# Patient Record
Sex: Male | Born: 1965 | Race: White | Hispanic: No | Marital: Married | Smoking: Former smoker
Health system: Southern US, Community
[De-identification: ages and names within clinical notes are randomized; demographics above are authoritative.]

## PROBLEM LIST (undated history)

## (undated) DIAGNOSIS — F329 Major depressive disorder, single episode, unspecified: Secondary | ICD-10-CM

## (undated) DIAGNOSIS — M1990 Unspecified osteoarthritis, unspecified site: Secondary | ICD-10-CM

## (undated) DIAGNOSIS — F419 Anxiety disorder, unspecified: Secondary | ICD-10-CM

## (undated) DIAGNOSIS — F32A Depression, unspecified: Secondary | ICD-10-CM

## (undated) HISTORY — DX: Depression, unspecified: F32.A

## (undated) HISTORY — DX: Major depressive disorder, single episode, unspecified: F32.9

## (undated) HISTORY — PX: KNEE ARTHROSCOPY: SUR90

## (undated) HISTORY — DX: Anxiety disorder, unspecified: F41.9

---

## 2016-01-27 DIAGNOSIS — M545 Low back pain: Secondary | ICD-10-CM | POA: Diagnosis not present

## 2016-01-27 DIAGNOSIS — G4700 Insomnia, unspecified: Secondary | ICD-10-CM | POA: Diagnosis not present

## 2016-01-30 ENCOUNTER — Other Ambulatory Visit: Payer: Self-pay | Admitting: Family Medicine

## 2016-01-30 DIAGNOSIS — G4700 Insomnia, unspecified: Secondary | ICD-10-CM

## 2016-02-04 ENCOUNTER — Ambulatory Visit
Admission: RE | Admit: 2016-02-04 | Discharge: 2016-02-04 | Disposition: A | Discharge status: Home / Self Care | Payer: Self-pay | Source: Ambulatory Visit | Attending: Family Medicine | Admitting: Family Medicine

## 2016-02-04 ENCOUNTER — Encounter (INDEPENDENT_AMBULATORY_CARE_PROVIDER_SITE_OTHER): Payer: Self-pay

## 2016-02-04 DIAGNOSIS — G4700 Insomnia, unspecified: Secondary | ICD-10-CM

## 2016-02-04 DIAGNOSIS — M48061 Spinal stenosis, lumbar region without neurogenic claudication: Secondary | ICD-10-CM | POA: Diagnosis not present

## 2016-02-15 DIAGNOSIS — M47817 Spondylosis without myelopathy or radiculopathy, lumbosacral region: Secondary | ICD-10-CM | POA: Diagnosis not present

## 2016-02-15 DIAGNOSIS — M47816 Spondylosis without myelopathy or radiculopathy, lumbar region: Secondary | ICD-10-CM | POA: Diagnosis not present

## 2016-02-15 DIAGNOSIS — M519 Unspecified thoracic, thoracolumbar and lumbosacral intervertebral disc disorder: Secondary | ICD-10-CM | POA: Diagnosis not present

## 2016-02-15 DIAGNOSIS — M5442 Lumbago with sciatica, left side: Secondary | ICD-10-CM | POA: Diagnosis not present

## 2016-02-15 DIAGNOSIS — G8929 Other chronic pain: Secondary | ICD-10-CM | POA: Diagnosis not present

## 2016-02-15 DIAGNOSIS — M5136 Other intervertebral disc degeneration, lumbar region: Secondary | ICD-10-CM | POA: Diagnosis not present

## 2016-03-06 DIAGNOSIS — M5416 Radiculopathy, lumbar region: Secondary | ICD-10-CM | POA: Diagnosis not present

## 2016-04-25 DIAGNOSIS — M5127 Other intervertebral disc displacement, lumbosacral region: Secondary | ICD-10-CM | POA: Diagnosis not present

## 2016-04-25 DIAGNOSIS — H52223 Regular astigmatism, bilateral: Secondary | ICD-10-CM | POA: Diagnosis not present

## 2016-04-25 DIAGNOSIS — M48062 Spinal stenosis, lumbar region with neurogenic claudication: Secondary | ICD-10-CM | POA: Diagnosis not present

## 2016-04-25 DIAGNOSIS — M5416 Radiculopathy, lumbar region: Secondary | ICD-10-CM | POA: Diagnosis not present

## 2016-08-07 DIAGNOSIS — G4700 Insomnia, unspecified: Secondary | ICD-10-CM | POA: Diagnosis not present

## 2016-09-19 DIAGNOSIS — Z832 Family history of diseases of the blood and blood-forming organs and certain disorders involving the immune mechanism: Secondary | ICD-10-CM | POA: Diagnosis not present

## 2016-11-12 DIAGNOSIS — G4700 Insomnia, unspecified: Secondary | ICD-10-CM | POA: Diagnosis not present

## 2016-11-12 DIAGNOSIS — D6862 Lupus anticoagulant syndrome: Secondary | ICD-10-CM | POA: Diagnosis not present

## 2016-11-12 DIAGNOSIS — H9313 Tinnitus, bilateral: Secondary | ICD-10-CM | POA: Diagnosis not present

## 2016-11-19 DIAGNOSIS — H903 Sensorineural hearing loss, bilateral: Secondary | ICD-10-CM | POA: Diagnosis not present

## 2016-11-19 DIAGNOSIS — H9313 Tinnitus, bilateral: Secondary | ICD-10-CM | POA: Diagnosis not present

## 2016-11-19 DIAGNOSIS — H905 Unspecified sensorineural hearing loss: Secondary | ICD-10-CM | POA: Diagnosis not present

## 2016-11-19 DIAGNOSIS — H938X1 Other specified disorders of right ear: Secondary | ICD-10-CM | POA: Diagnosis not present

## 2016-11-28 ENCOUNTER — Ambulatory Visit (HOSPITAL_BASED_OUTPATIENT_CLINIC_OR_DEPARTMENT_OTHER): Payer: 59 | Admitting: Hematology

## 2016-11-28 ENCOUNTER — Encounter: Payer: Self-pay | Admitting: Hematology

## 2016-11-28 ENCOUNTER — Ambulatory Visit (HOSPITAL_BASED_OUTPATIENT_CLINIC_OR_DEPARTMENT_OTHER): Payer: 59

## 2016-11-28 ENCOUNTER — Telehealth: Payer: Self-pay | Admitting: Hematology

## 2016-11-28 VITALS — BP 130/90 | HR 71 | Temp 97.8°F | Resp 18 | Ht 68.0 in | Wt 227.4 lb

## 2016-11-28 DIAGNOSIS — Z832 Family history of diseases of the blood and blood-forming organs and certain disorders involving the immune mechanism: Secondary | ICD-10-CM | POA: Diagnosis not present

## 2016-11-28 DIAGNOSIS — D688 Other specified coagulation defects: Secondary | ICD-10-CM

## 2016-11-28 DIAGNOSIS — D6862 Lupus anticoagulant syndrome: Secondary | ICD-10-CM | POA: Diagnosis not present

## 2016-11-28 NOTE — Telephone Encounter (Signed)
Scheduled appt per 11/21 los - RTC on as needed basis per los.

## 2016-11-28 NOTE — Progress Notes (Signed)
HEMATOLOGY/ONCOLOGY CONSULTATION NOTE  Date of Service: 11/28/2016  Patient Care Team: ViaCaryn Bee, Kevin, MD as PCP - General (Family Medicine)  CHIEF COMPLAINTS/PURPOSE OF CONSULTATION:   Family history of possible thrombophilia  lupus anticoagulant positive without personal history of previous thrombosis  HISTORY OF PRESENTING ILLNESS:   Wayne KohutRobert Daniels is a wonderful 51 y.o. male with history of PTSD who has been referred to us by Dr Caryn BeeKevin Via for evaluation and management of possible Hhx of clotting disorder and is lupus anticoagulant positive.   It was noted that the pt recently discovered a history of clotting disorder in his family.  He is accompanied today by his wife. He is generally doing well overall. He states being concerned due to a history of his cousins having bilateral DVTs and his sister having a clot. He reports that he does know know of a specific mutation in the genetic screenings of his family. He reports that he has not been taking his aspirin. His mother passed of lung cancer and he had a family member with colon cancer as well. He recently spent a few months training for a knife or death competition which recently aired on television.   He later noted that one of the cousins reported having a MTHFR gene mutation.  Patient notes that he personally has never had an issues with a VTE or arterial thrombosis.  He had a hypercoagulability w/u with his PCP which was unrevealing for any inherited hypercoagulable state. Prothrombin gene mutation was subsequently also done which was neg. For unclear reasons anti-phospholipid antibody workup was also sent and was neg for cardiolipin antibody and beta2 glycoprotein antibody but was noted to show presence of lupus anticoagulant.  He was referred to us for further evaluation and management of positive lupus anticoagulant. He has no diagnosed associated autoimmune condition.  On review of systems, pt denies fever, chills, night  sweats and any other accompanying symptoms. No CP/SOB or leg swelling.  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 KNEE ARTHROSCOPY Right     SOCIAL HISTORY: Social History   Socioeconomic History  . Marital status: Married    Spouse name: Not on file  . Number of children: Not on file  . Years of education: Not on file  . Highest education level: Not on file  Social Needs  . Financial resource strain: Not on file  . Food insecurity - worry: Not on file  . Food insecurity - inability: Not on file  . Transportation needs - medical: Not on file  . Transportation needs - non-medical: Not on file  Occupational History  . Not on file  Tobacco Use  . Smoking status: Former Games developermoker  . Smokeless tobacco: Never Used  Substance and Sexual Activity  . Alcohol use: No    Frequency: Never  . Drug use: No  . Sexual activity: Yes  Other Topics Concern  . Not on file  Social History Narrative  . Not on file    FAMILY HISTORY: Family History  Problem Relation Age of Onset  . Cancer Mother   . Diabetes Father   . Hypertension Father   . Clotting disorder Sister   . Clotting disorder Paternal Aunt   . Clotting disorder Paternal Uncle     ALLERGIES:  has no allergies on file.  MEDICATIONS:  Centrum-tablet orally Omega 3 1000 MG capsule 1 capsule  orally twice a day Potassium 80 mg oral twice a day OTC bosteo bi flex three times daily Celecoxib 400 mg capsule 1 capsule with food orally once a day Gabapentin 600 MG tablet 1-2 tablets orally twice a day Sertraline HCI 50 mg tablet 1 tablet orally once a day Clonazepam 1 MG tablet 1-2 tablet as needed for sleep orally once a day must last 30 days, notes: VIA Cyclobenzaprine HCI 5 MG tablet 1 tablet as need orally three times a day Aspir-81(aspirin) 81 MG tablet delayed release 1 tablet orally  once a day   Current Outpatient Medications  Medication Sig Dispense Refill  . celecoxib (CELEBREX) 200 MG capsule Take 200 mg by mouth 2 (two) times daily.    Marland Kitchen. doxepin (SINEQUAN) 75 MG capsule Take 75 mg by mouth.    . gabapentin (NEURONTIN) 600 MG tablet Take 600 mg by mouth 3 (three) times daily.     No current facility-administered medications for this visit.     REVIEW OF SYSTEMS:    10 Point review of Systems was done is negative except as noted above.  PHYSICAL EXAMINATION: ECOG PERFORMANCE STATUS: 0 - Asymptomatic  . Vitals:   11/28/16 1011  BP: 130/90  Pulse: 71  Resp: 18  Temp: 97.8 F (36.6 C)  SpO2: 98%   Filed Weights   11/28/16 1011  Weight: 227 lb 6.4 oz (103.1 kg)   .Body mass index is 34.58 kg/m.  GENERAL:alert, in no acute distress and comfortable SKIN: no acute rashes, no significant lesions EYES: conjunctiva are pink and non-injected, sclera anicteric OROPHARYNX: MMM, no exudates, no oropharyngeal erythema or ulceration NECK: supple, no JVD LYMPH:  no palpable lymphadenopathy in the cervical, axillary or inguinal regions LUNGS: clear to auscultation b/l with normal respiratory effort HEART: regular rate & rhythm ABDOMEN:  normoactive bowel sounds , non tender, not distended. Extremity: no pedal edema PSYCH: alert & oriented x 3 with fluent speech NEURO: no focal motor/sensory deficits  LABORATORY DATA:  I have reviewed the data as listed  Prothrombin gene mutation  Order: [id number]  Status:  Final result Visible to patient:  No (Not Released) Next appt:  None Dx:  Hereditary thrombophilia (HCC); Lupus...  Component 8d ago  Factor II, DNA Analysis Comment   Comment: NEGATIVE  No mutation identified.            RADIOGRAPHIC STUDIES: I have personally reviewed the radiological images as listed and agreed with the findings in the report. No results found.  ASSESSMENT & PLAN:  Wayne KohutRobert Daniels is a wonderful 51 y.o. male with    1)Family history of possible thrombophilia He reports a cousin with heterozygous MTHFR gene mutation which we discuss is a controversial risk factor for VTE. Patient had a hypercoag w/u with PCP which was unrevealing for any overt inherited thrombophilia We did a prothrombin gene mutation study to complete the w/u and this was neg  2) Lupus anticoagulant positive  Unclear why this testing was done. Patient has no personal hx of VTE and no known autoimmune conditions. I discussed with the patient that the significance of this findings in uncertain. We also do not know if the lupus anticoagulant antibody is transient or persistent.  PLAN  -Discussed all the lab testing available at the time of the visit. -no overt finding suggestive of inherited hypercoagulability at this time. --Discussed triggers and risk factors of blood clotting with pt and his wife and ways to mitigate the risk. -unclear significant of positive lupus  anticoagulant in the absence of known h/o of thrombosis and absence of diagnosed autoimmune condition. -would recommend rpt lupus anticoagulant testing with PCP in 3 months to evaluate if the lupus anticoagulant is persistent or only transient. -reasonable to take ASA 81mg  po daily for VTE prophylaxis given uncertainly of lupus anticoag related VTE risk -no other additional recommendations at this time.  Continue f/u with PCP RTC with Dr Candise Che PRN  All of the patients and his wife's questions were answered with apparent satisfaction. The patient knows to call the clinic with any problems, questions or concerns.  I spent 45 minutes counseling the patient face to face. The total time spent in the appointment was 60 minutes and more than 50% was on counseling and direct patient cares.    Wyvonnia Lora MD MS AAHIVMS [facility] Hematology/Oncology Physician [facility]  (Office):       [phone] (Work cell):  [phone] (Fax):            [phone]  11/28/2016 11:21 AM    This document serves as a record of services personally performed by Wyvonnia Lora, MD. It was created on his behalf by Barney Drain, a trained medical scribe. The creation of this record is based on the scribe's personal observations and the provider's statements to them.  .I have reviewed the above documentation for accuracy and completeness, and I agree with the above. Johney Maine MD MS

## 2016-11-29 LAB — APTT: APTT: 50 s — AB (ref 24–33)

## 2016-12-01 LAB — LUPUS ANTICOAGULANT PANEL
Hexagonal Phase Phospholipid: 77 s — ABNORMAL HIGH (ref 0–11)
PTT-LA MIX: 89.3 s — AB (ref 0.0–48.9)
PTT-LA: 93.7 s — AB (ref 0.0–51.9)
dRVVT Confirm: 2.3 ratio — ABNORMAL HIGH (ref 0.8–1.2)
dRVVT Mix: 76.5 s — ABNORMAL HIGH (ref 0.0–47.0)
dRVVT: 84.1 s — ABNORMAL HIGH (ref 0.0–47.0)

## 2016-12-05 LAB — PROTHROMBIN GENE MUTATION

## 2017-02-20 DIAGNOSIS — R52 Pain, unspecified: Secondary | ICD-10-CM | POA: Diagnosis not present

## 2017-02-20 DIAGNOSIS — B349 Viral infection, unspecified: Secondary | ICD-10-CM | POA: Diagnosis not present

## 2017-02-24 IMAGING — MR MR LUMBAR SPINE W/O CM
4 of 5 series · 19 of 48 positions shown · non-contrast
Comparison: None.

CLINICAL DATA: Low back pain

EXAM:
MRI LUMBAR SPINE WITHOUT CONTRAST
TECHNIQUE: Multiplanar, multisequence MR imaging of the lumbar spine was
performed. No intravenous contrast was administered.

[Series 6: T2 · sagittal · 4.0mm · 0.73mm/px · 6 of 15 slices shown (1 of 2)]
[im 1/15]
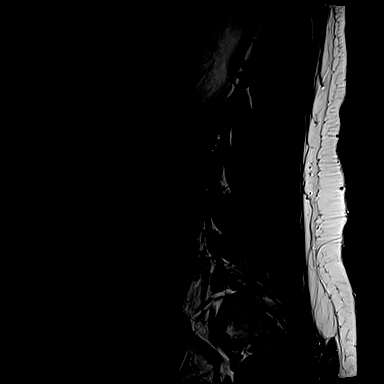
[im 3/15]
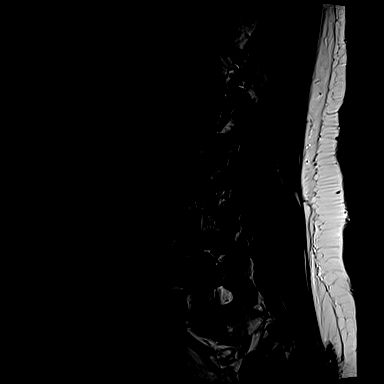
[im 6/15]
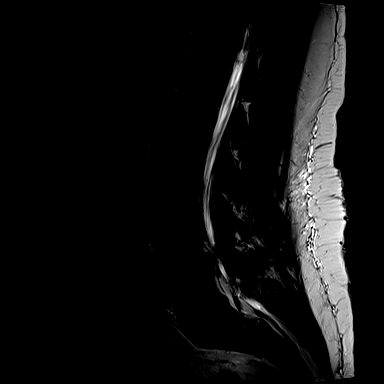
[im 9/15]
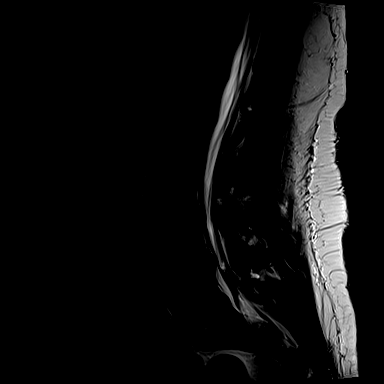
[im 12/15]
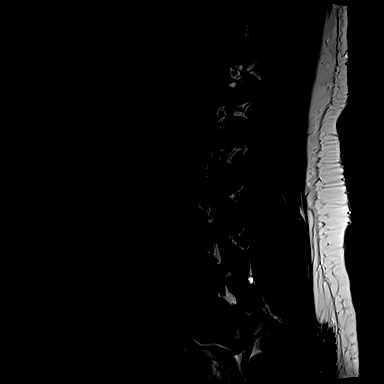
[im 15/15]
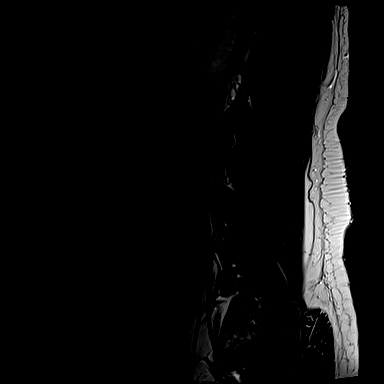

[Series 7: T1 · sagittal · 4.0mm · 0.73mm/px · 3 of 15 slices shown (1 of 2)]
[im 3/15]
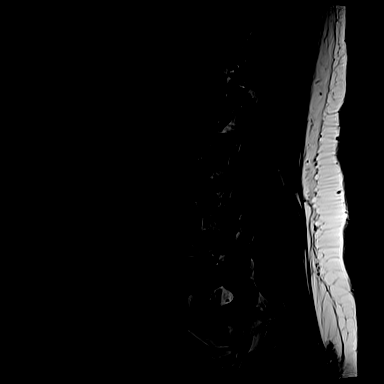
[im 9/15]
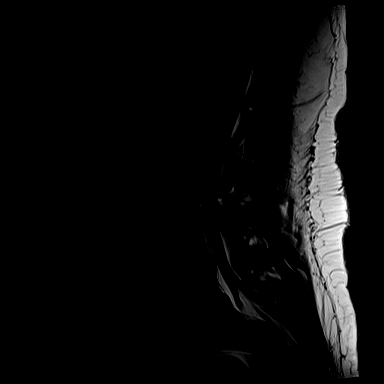
[im 15/15]
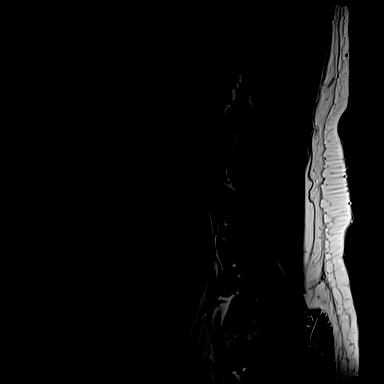

[Series 11: T1 · axial · 4.0mm · 0.28mm/px · z∈[-61,+88]mm · 3 of 36 slices shown (2 of 2)]
[im 6/36]
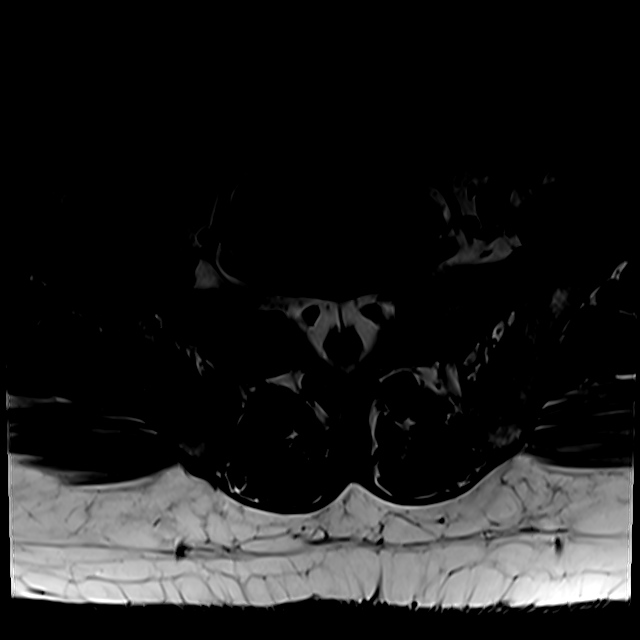
[im 18/36]
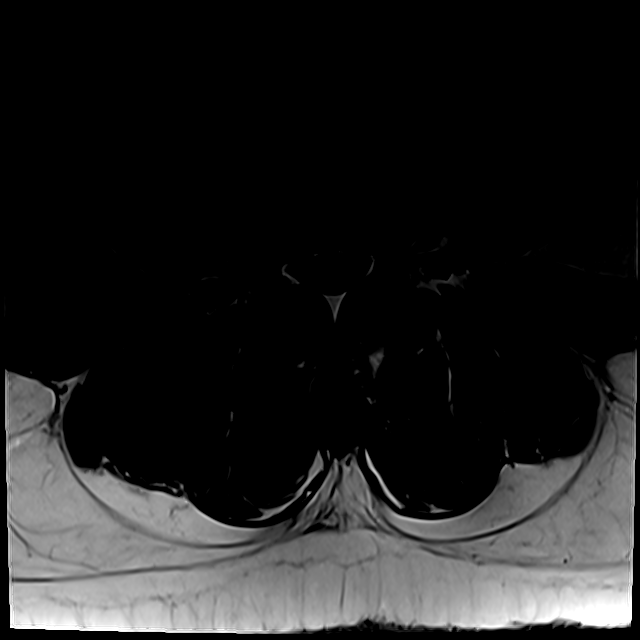
[im 31/36]
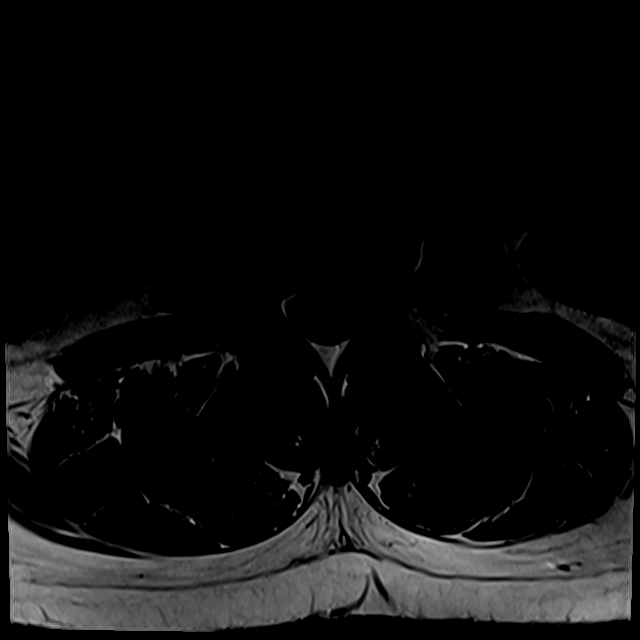

[Series 14: T2 · axial · 4.0mm · 0.28mm/px · z∈[-85,+88]mm · 7 of 36 slices shown (2 of 2)]
[im 1/36]
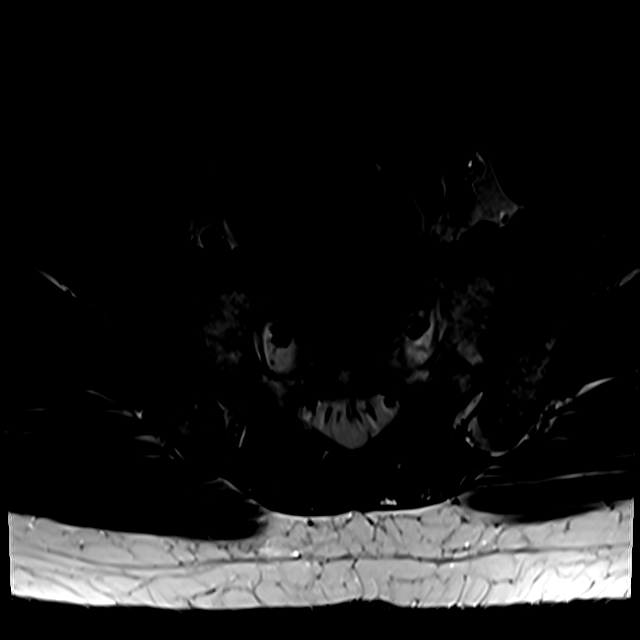
[im 6/36]
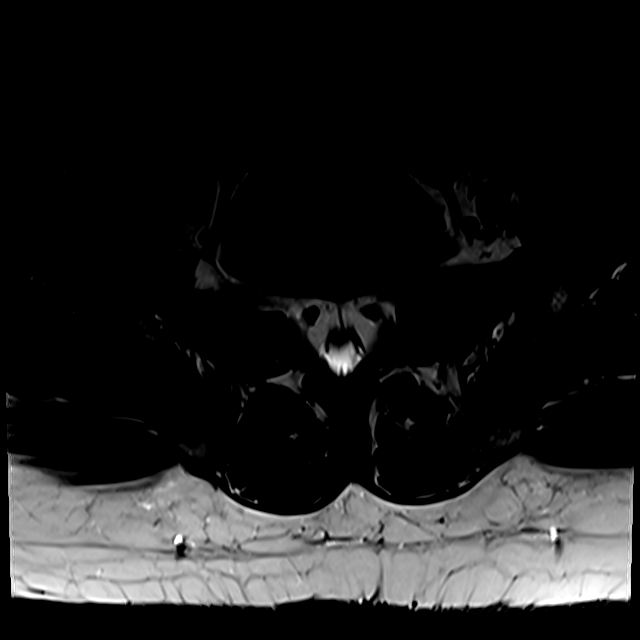
[im 11/36]
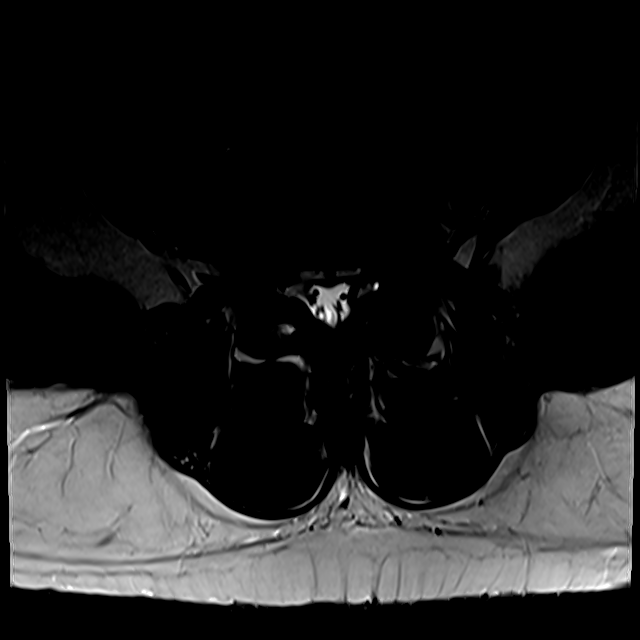
[im 16/36]
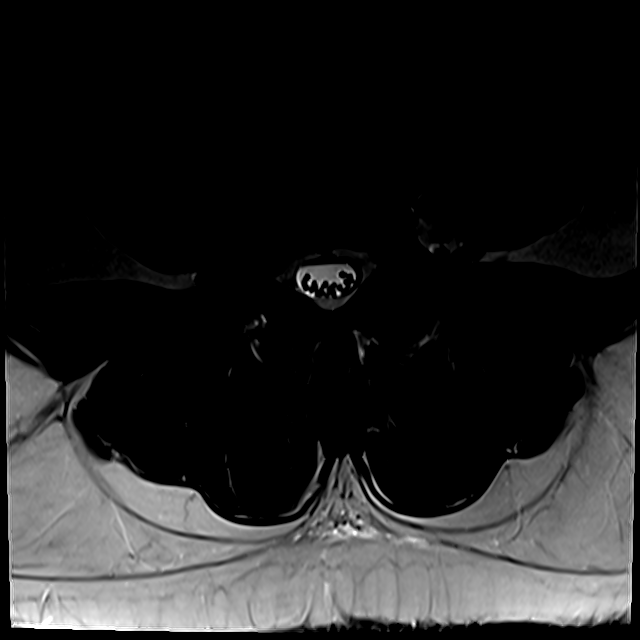
[im 18/36]
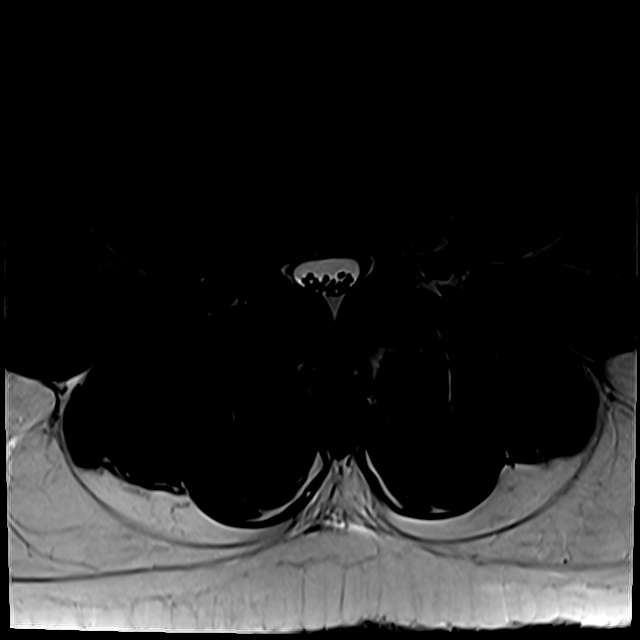
[im 21/36]
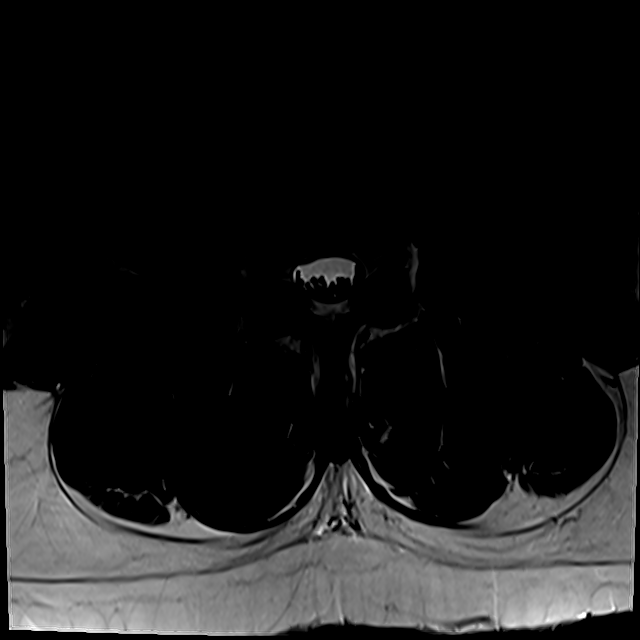
[im 31/36]
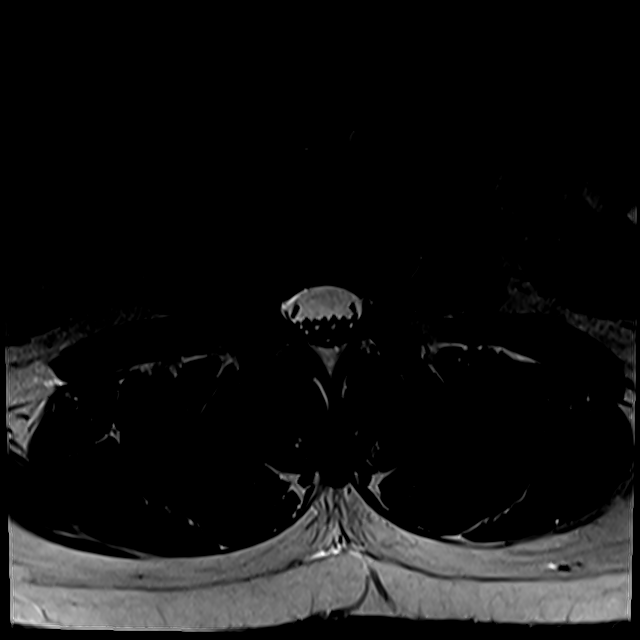

[19 of 48 positions shown; findings below may reference images not displayed]

FINDINGS: Segmentation:  Normal

Alignment:  Normal

Vertebrae:  Normal bone marrow.  Negative for fracture or mass.

Conus medullaris: Extends to the L1-2 level and appears normal.

Paraspinal and other soft tissues: Paraspinous muscles well
developed and normal. Retroperitoneal structures normal

Disc levels:

L1-2:  Negative

L2-3:  Negative

L3-4: Mild disc degeneration and disc bulging. Small right foraminal
disc protrusion without neural compression.

L4-5: Moderate disc degeneration with disc space narrowing. Endplate
spurring on the left causing left foraminal encroachment. In
addition, there is an extraforaminal disc protrusion on the left
with displacement of the left L4 nerve root. Mild facet
degeneration. Mild spinal stenosis.

L5-S1:  Negative
IMPRESSION: Small right foraminal disc protrusion L3-4 without neural
compression

Disc degeneration and spurring on the left at L4-5 with left
foraminal encroachment. In addition, there is a left extraforaminal
disc protrusion at L4-5. Mild spinal stenosis L4-5.

## 2017-06-04 DIAGNOSIS — M5127 Other intervertebral disc displacement, lumbosacral region: Secondary | ICD-10-CM | POA: Diagnosis not present

## 2017-06-04 DIAGNOSIS — M5416 Radiculopathy, lumbar region: Secondary | ICD-10-CM | POA: Diagnosis not present

## 2017-06-04 DIAGNOSIS — M48062 Spinal stenosis, lumbar region with neurogenic claudication: Secondary | ICD-10-CM | POA: Diagnosis not present

## 2017-07-17 DIAGNOSIS — M79672 Pain in left foot: Secondary | ICD-10-CM | POA: Diagnosis not present

## 2017-10-09 DIAGNOSIS — M5127 Other intervertebral disc displacement, lumbosacral region: Secondary | ICD-10-CM | POA: Diagnosis not present

## 2017-10-09 DIAGNOSIS — M48062 Spinal stenosis, lumbar region with neurogenic claudication: Secondary | ICD-10-CM | POA: Diagnosis not present

## 2017-10-09 DIAGNOSIS — G4700 Insomnia, unspecified: Secondary | ICD-10-CM | POA: Diagnosis not present

## 2022-06-13 NOTE — Progress Notes (Signed)
Sent message, via epic in basket, requesting orders in epic from surgeon.  

## 2022-06-18 ENCOUNTER — Ambulatory Visit: Payer: Self-pay | Admitting: Student

## 2022-06-18 NOTE — Progress Notes (Addendum)
Anesthesia Review:  PCP: 05/09/22 clearance on chart -  Dr Terrilee Files- [facility] [place]  Cardiologist : none  Chest x-ray : EKG : Echo : Stress test: Cardiac Cath :  Activity level: can do a flight of stairs without difficuty  Sleep Study/ CPAP : none  Fasting Blood Sugar :      / Checks Blood Sugar -- times a day:   Blood Thinner/ Instructions /Last Dose: ASA / Instructions/ Last Dose :    PT was no show for preop on 06/20/22.  Called pt and he stated he has a cold.  Has not been seen by a physician.  Completed med hx and instructiosn via phone.  Coming in on 06/25/2022  for labsas long as he is well and will call preop nurse if he has not recovered.  PT instsructed to notify DR Swinteck today on 06/20/22 and let him be aware.  Pt voiced understasnding.     PT came in on 06/25/22 for labs.  Preop instructons along with hibiclens, etc given to pt.  PT voices no complaints.

## 2022-06-18 NOTE — Patient Instructions (Signed)
SURGICAL WAITING ROOM VISITATION  Patients having surgery or a procedure may have no more than 2 support people in the waiting area - these visitors may rotate.    Children under the age of 49 must have an adult with them who is not the patient.  Due to an increase in RSV and influenza rates and associated hospitalizations, children ages 16 and under may not visit patients in [facility] hospitals.  If the patient needs to stay at the hospital during part of their recovery, the visitor guidelines for inpatient rooms apply. Pre-op nurse will coordinate an appropriate time for 1 support person to accompany patient in pre-op.  This support person may not rotate.    Please refer to the [facility] website for the visitor guidelines for Inpatients (after your surgery is over and you are in a regular room).       Your procedure is scheduled on:  06/28/2022    Report to [facility] Main Entrance    Report to admitting at   [phone]   Call this number if you have problems the morning of surgery [phone]   Do not eat food :After Midnight.   After Midnight you may have the following liquids until ___ 0845___ AM  DAY OF SURGERY  Water Non-Citrus Juices (without pulp, NO RED-Apple, White grape, White cranberry) Black Coffee (NO MILK/CREAM OR CREAMERS, sugar ok)  Clear Tea (NO MILK/CREAM OR CREAMERS, sugar ok) regular and decaf                             Plain Jell-O (NO RED)                                           Fruit ices (not with fruit pulp, NO RED)                                     Popsicles (NO RED)                                                               Sports drinks like Gatorade (NO RED)                    The day of surgery:  Drink ONE (1) Pre-Surgery Clear Ensure or G2 at     0845AM ( have completed by ) the morning of surgery. Drink in one sitting. Do not sip.  This drink was given to you during your hospital  pre-op appointment visit. Nothing else to  drink after completing the  Pre-Surgery Clear Ensure or G2.          If you have questions, please contact your surgeon's office.     Oral Hygiene is also important to reduce your risk of infection.                                    Remember - BRUSH YOUR TEETH THE MORNING OF SURGERY WITH  YOUR REGULAR TOOTHPASTE  DENTURES WILL BE REMOVED PRIOR TO SURGERY PLEASE DO NOT APPLY "Poly grip" OR ADHESIVES!!!   Do NOT smoke after Midnight   Take these medicines the morning of surgery with A SIP OF WATER:  gabapentin   DO NOT TAKE ANY ORAL DIABETIC MEDICATIONS DAY OF YOUR SURGERY  Bring CPAP mask and tubing day of surgery.                              You may not have any metal on your body including hair pins, jewelry, and body piercing             Do not wear make-up, lotions, powders, perfumes/cologne, or deodorant  Do not wear nail polish including gel and S&S, artificial/acrylic nails, or any other type of covering on natural nails including finger and toenails. If you have artificial nails, gel coating, etc. that needs to be removed by a nail salon please have this removed prior to surgery or surgery may need to be canceled/ delayed if the surgeon/ anesthesia feels like they are unable to be safely monitored.   Do not shave  48 hours prior to surgery.               Men may shave face and neck.   Do not bring valuables to the hospital. [facility] IS NOT             RESPONSIBLE   FOR VALUABLES.   Contacts, glasses, dentures or bridgework may not be worn into surgery.   Bring small overnight bag day of surgery.   DO NOT BRING YOUR HOME MEDICATIONS TO THE HOSPITAL. PHARMACY WILL DISPENSE MEDICATIONS LISTED ON YOUR MEDICATION LIST TO YOU DURING YOUR ADMISSION IN THE HOSPITAL!    Patients discharged on the day of surgery will not be allowed to drive home.  Someone NEEDS to stay with you for the first 24 hours after anesthesia.   Special Instructions: Bring a copy of your healthcare  power of attorney and living will documents the day of surgery if you haven't scanned them before.              Please read over the following fact sheets you were given: IF YOU HAVE QUESTIONS ABOUT YOUR PRE-OP INSTRUCTIONS PLEASE CALL [phone]   If you received a COVID test during your pre-op visit  it is requested that you wear a mask when out in public, stay away from anyone that may not be feeling well and notify your surgeon if you develop symptoms. If you test positive for Covid or have been in contact with anyone that has tested positive in the last 10 days please notify you surgeon.      Pre-operative 5 CHG Bath Instructions   You can play a key role in reducing the risk of infection after surgery. Your skin needs to be as free of germs as possible. You can reduce the number of germs on your skin by washing with CHG (chlorhexidine gluconate) soap before surgery. CHG is an antiseptic soap that kills germs and continues to kill germs even after washing.   DO NOT use if you have an allergy to chlorhexidine/CHG or antibacterial soaps. If your skin becomes reddened or irritated, stop using the CHG and notify one of our RNs at [phone].   Please shower with the CHG soap starting 4 days before surgery using the following schedule:  Please keep in mind the following:  DO NOT shave, including legs and underarms, starting the day of your first shower.   You may shave your face at any point before/day of surgery.  Place clean sheets on your bed the day you start using CHG soap. Use a clean washcloth (not used since being washed) for each shower. DO NOT sleep with pets once you start using the CHG.   CHG Shower Instructions:  If you choose to wash your hair and private area, wash first with your normal shampoo/soap.  After you use shampoo/soap, rinse your hair and body thoroughly to remove shampoo/soap residue.  Turn the water OFF and apply about 3 tablespoons (45 ml) of CHG soap  to a CLEAN washcloth.  Apply CHG soap ONLY FROM YOUR NECK DOWN TO YOUR TOES (washing for 3-5 minutes)  DO NOT use CHG soap on face, private areas, open wounds, or sores.  Pay special attention to the area where your surgery is being performed.  If you are having back surgery, having someone wash your back for you may be helpful. Wait 2 minutes after CHG soap is applied, then you may rinse off the CHG soap.  Pat dry with a clean towel  Put on clean clothes/pajamas   If you choose to wear lotion, please use ONLY the CHG-compatible lotions on the back of this paper.     Additional instructions for the day of surgery: DO NOT APPLY any lotions, deodorants, cologne, or perfumes.   Put on clean/comfortable clothes.  Brush your teeth.  Ask your nurse before applying any prescription medications to the skin.      CHG Compatible Lotions   Aveeno Moisturizing lotion  Cetaphil Moisturizing Cream  Cetaphil Moisturizing Lotion  Clairol Herbal Essence Moisturizing Lotion, Dry Skin  Clairol Herbal Essence Moisturizing Lotion, Extra Dry Skin  Clairol Herbal Essence Moisturizing Lotion, Normal Skin  Curel Age Defying Therapeutic Moisturizing Lotion with Alpha Hydroxy  Curel Extreme Care Body Lotion  Curel Soothing Hands Moisturizing Hand Lotion  Curel Therapeutic Moisturizing Cream, Fragrance-Free  Curel Therapeutic Moisturizing Lotion, Fragrance-Free  Curel Therapeutic Moisturizing Lotion, Original Formula  Eucerin Daily Replenishing Lotion  Eucerin Dry Skin Therapy Plus Alpha Hydroxy Crme  Eucerin Dry Skin Therapy Plus Alpha Hydroxy Lotion  Eucerin Original Crme  Eucerin Original Lotion  Eucerin Plus Crme Eucerin Plus Lotion  Eucerin TriLipid Replenishing Lotion  Keri Anti-Bacterial Hand Lotion  Keri Deep Conditioning Original Lotion Dry Skin Formula Softly Scented  Keri Deep Conditioning Original Lotion, Fragrance Free Sensitive Skin Formula  Keri Lotion Fast Absorbing Fragrance Free  Sensitive Skin Formula  Keri Lotion Fast Absorbing Softly Scented Dry Skin Formula  Keri Original Lotion  Keri Skin Renewal Lotion Keri Silky Smooth Lotion  Keri Silky Smooth Sensitive Skin Lotion  Nivea Body Creamy Conditioning Oil  Nivea Body Extra Enriched Teacher, adult education Moisturizing Lotion Nivea Crme  Nivea Skin Firming Lotion  NutraDerm 30 Skin Lotion  NutraDerm Skin Lotion  NutraDerm Therapeutic Skin Cream  NutraDerm Therapeutic Skin Lotion  ProShield Protective Hand Cream  Provon moisturizing lotion

## 2022-06-18 NOTE — Progress Notes (Signed)
Second request for pre op orders in [facility]: Spoke with Kerri at [facility].

## 2022-06-20 ENCOUNTER — Encounter (HOSPITAL_COMMUNITY)
Admission: RE | Admit: 2022-06-20 | Discharge: 2022-06-20 | Disposition: A | Discharge status: Home / Self Care | Payer: Non-veteran care | Source: Ambulatory Visit | Attending: Orthopedic Surgery

## 2022-06-20 ENCOUNTER — Encounter (HOSPITAL_COMMUNITY): Payer: Self-pay

## 2022-06-20 ENCOUNTER — Other Ambulatory Visit: Payer: Self-pay

## 2022-06-20 HISTORY — DX: Unspecified osteoarthritis, unspecified site: M19.90

## 2022-06-25 ENCOUNTER — Encounter (HOSPITAL_COMMUNITY)
Admission: RE | Admit: 2022-06-25 | Discharge: 2022-06-25 | Disposition: A | Discharge status: Home / Self Care | Payer: No Typology Code available for payment source | Source: Ambulatory Visit | Attending: Orthopedic Surgery | Admitting: Orthopedic Surgery

## 2022-06-25 ENCOUNTER — Ambulatory Visit: Payer: Self-pay | Admitting: Student

## 2022-06-25 DIAGNOSIS — Z01812 Encounter for preprocedural laboratory examination: Secondary | ICD-10-CM | POA: Diagnosis present

## 2022-06-25 DIAGNOSIS — Z01818 Encounter for other preprocedural examination: Secondary | ICD-10-CM

## 2022-06-25 LAB — SURGICAL PCR SCREEN
MRSA, PCR: NEGATIVE
Staphylococcus aureus: POSITIVE — AB

## 2022-06-25 LAB — CBC
HCT: 43 % (ref 39.0–52.0)
Hemoglobin: 14.5 g/dL (ref 13.0–17.0)
MCH: 29.8 pg (ref 26.0–34.0)
MCHC: 33.7 g/dL (ref 30.0–36.0)
MCV: 88.5 fL (ref 80.0–100.0)
Platelets: 200 10*3/uL (ref 150–400)
RBC: 4.86 MIL/uL (ref 4.22–5.81)
RDW: 12.4 % (ref 11.5–15.5)
WBC: 4.3 10*3/uL (ref 4.0–10.5)
nRBC: 0 % (ref 0.0–0.2)

## 2022-06-25 NOTE — H&P (View-Only) (Signed)
TOTAL HIP ADMISSION H&P  Patient is admitted for right total hip arthroplasty.  Subjective:  Chief Complaint: right hip pain  HPI: Wayne Daniels, 57 y.o. male, has a history of pain and functional disability in the right hip(s) due to arthritis and patient has failed non-surgical conservative treatments for greater than 12 weeks to include NSAID's and/or analgesics, corticosteriod injections, flexibility and strengthening excercises, use of assistive devices, and activity modification.  Onset of symptoms was gradual starting 5 years ago with rapidlly worsening course since that time.The patient noted no past surgery on the right hip(s).  Patient currently rates pain in the right hip at 10 out of 10 with activity. Patient has night pain, worsening of pain with activity and weight bearing, trendelenberg gait, pain that interfers with activities of daily living, and pain with passive range of motion. Patient has evidence of subchondral cysts, subchondral sclerosis, periarticular osteophytes, and joint space narrowing by imaging studies. This condition presents safety issues increasing the risk of falls.  There is no current active infection.  There are no problems to display for this patient.  Past Medical History:  Diagnosis Date   Arthritis     Past Surgical History:  Procedure Laterality Date   KNEE ARTHROSCOPY Right     Current Outpatient Medications  Medication Sig Dispense Refill Last Dose   acetaminophen (TYLENOL) 500 MG tablet Take 1,000 mg by mouth every 6 (six) hours as needed for moderate pain or mild pain.      aspirin EC 81 MG tablet Take 81 mg by mouth daily. Swallow whole.      buPROPion (WELLBUTRIN XL) 300 MG 24 hr tablet Take 300 mg by mouth daily.      busPIRone (BUSPAR) 10 MG tablet Take 5 mg by mouth 3 (three) times daily as needed (Anxiety).      doxepin (SINEQUAN) 150 MG capsule Take 150 mg by mouth at bedtime.      gabapentin (NEURONTIN) 300 MG capsule Take 900 mg by  mouth at bedtime.      meloxicam (MOBIC) 15 MG tablet Take 15 mg by mouth daily.      OVER THE COUNTER MEDICATION Place 1 drop into both ears 2 (two) times a week. Natural Ear      Polyethyl Glycol-Propyl Glycol (SYSTANE ULTRA OP) Place 2 drops into both eyes daily as needed (Dry eyes).      rosuvastatin (CRESTOR) 40 MG tablet Take 40 mg by mouth daily.      sildenafil (VIAGRA) 100 MG tablet Take 100 mg by mouth as needed for erectile dysfunction.      traMADol (ULTRAM) 50 MG tablet Take 50 mg by mouth every 12 (twelve) hours as needed for moderate pain or severe pain.      No current facility-administered medications for this visit.   Allergies  Allergen Reactions   Meperidine Other (See Comments)    Delusions (intolerance), Psychosis,hallucinations    Social History   Tobacco Use   Smoking status: Former   Smokeless tobacco: Never  Substance Use Topics   Alcohol use: No    Family History  Problem Relation Age of Onset   Cancer Mother    Diabetes Father    Hypertension Father    Clotting disorder Sister    Clotting disorder Paternal Aunt    Clotting disorder Paternal Uncle      Review of Systems  Musculoskeletal:  Positive for arthralgias and gait problem.  All other systems reviewed and are negative.   Objective:  Physical Exam Constitutional:      Appearance: Normal appearance.  HENT:     Head: Normocephalic and atraumatic.     Nose: Nose normal.     Mouth/Throat:     Mouth: Mucous membranes are moist.     Pharynx: Oropharynx is clear.  Eyes:     Conjunctiva/sclera: Conjunctivae normal.  Cardiovascular:     Rate and Rhythm: Normal rate and regular rhythm.     Pulses: Normal pulses.     Heart sounds: Normal heart sounds.  Pulmonary:     Effort: Pulmonary effort is normal.     Breath sounds: Normal breath sounds.  Abdominal:     General: Abdomen is flat.     Palpations: Abdomen is soft.  Genitourinary:    Comments: deferred Musculoskeletal:      Cervical back: Normal range of motion and neck supple.     Comments: Examination of the right hip reveals no skin wounds or lesions. Pain with flexion and rotation of the hip. Mild motion restriction. Mild trochanteric tenderness to palpation.   Neurovascularly intact distally.   Skin:    General: Skin is warm and dry.     Capillary Refill: Capillary refill takes less than 2 seconds.  Neurological:     General: No focal deficit present.     Mental Status: He is alert and oriented to person, place, and time.  Psychiatric:        Mood and Affect: Mood normal.        Behavior: Behavior normal.        Thought Content: Thought content normal.        Judgment: Judgment normal.     Vital signs in last 24 hours: @VSRANGES @  Labs:   Estimated body mass index is 39 kg/m (pended) as calculated from the following:   Height as of an earlier encounter on 06/25/22: (P) 5' 8.5" (1.74 m).   Weight as of an earlier encounter on 06/25/22: (P) 118 kg.   Imaging Review Plain radiographs demonstrate severe degenerative joint disease of the right hip(s). The bone quality appears to be adequate for age and reported activity level.      Assessment/Plan:  End stage arthritis, right hip(s)  The patient history, physical examination, clinical judgement of the provider and imaging studies are consistent with end stage degenerative joint disease of the right hip(s) and total hip arthroplasty is deemed medically necessary. The treatment options including medical management, injection therapy, arthroscopy and arthroplasty were discussed at length. The risks and benefits of total hip arthroplasty were presented and reviewed. The risks due to aseptic loosening, infection, stiffness, dislocation/subluxation,  thromboembolic complications and other imponderables were discussed.  The patient acknowledged the explanation, agreed to proceed with the plan and consent was signed. Patient is being admitted for  inpatient treatment for surgery, pain control, PT, OT, prophylactic antibiotics, VTE prophylaxis, progressive ambulation and ADL's and discharge planning.The patient is planning to be discharged home with HEP.   Therapy Plans: HEP. Discussed the importance of hip abductor strengthening exercises including 150-300 standing side leg raises per day. Also, non-pounding exercises with a stationary bike/elliptical for 30 minutes 3x/week. Disposition: Home with wife. Planned DVT Prophylaxis: aspirin 81mg  BID DME needed: walker. Printed copy given to patient and prescription sent to [place].  PCP: Cleared TXA: IV Allergies:  - Demerol - psychotic episode, 57 yo.  Anesthesia Concerns: None.  BMI: 39.0 Last HgbA1c: 6.5 Other: - He recently saw AO 06/08/22 due to a twisting left knee  injury. Left knee MRI showed medial meniscal tear, patient reports knee more stable and pain improved. Can proceed with surgery of his right hip.   - Hydrocodone, zofran, methocarbamol, meloxicam.  - 06/25/22: Hgb 14.5. No BMP ordered.     Patient's anticipated LOS is less than 2 midnights, meeting these requirements: - Younger than 58 - Lives within 1 hour of care - Has a competent adult at home to recover with post-op recover - NO history of  - Chronic pain requiring opiods  - Diabetes  - Coronary Artery Disease  - Heart failure  - Heart attack  - Stroke  - DVT/VTE  - Cardiac arrhythmia  - Respiratory Failure/COPD  - Renal failure  - Anemia  - Advanced Liver disease

## 2022-06-25 NOTE — H&P (Signed)
TOTAL HIP ADMISSION H&P  Patient is admitted for right total hip arthroplasty.  Subjective:  Chief Complaint: right hip pain  HPI: Wayne Daniels, 57 y.o. male, has a history of pain and functional disability in the right hip(s) due to arthritis and patient has failed non-surgical conservative treatments for greater than 12 weeks to include NSAID's and/or analgesics, corticosteriod injections, flexibility and strengthening excercises, use of assistive devices, and activity modification.  Onset of symptoms was gradual starting 5 years ago with rapidlly worsening course since that time.The patient noted no past surgery on the right hip(s).  Patient currently rates pain in the right hip at 10 out of 10 with activity. Patient has night pain, worsening of pain with activity and weight bearing, trendelenberg gait, pain that interfers with activities of daily living, and pain with passive range of motion. Patient has evidence of subchondral cysts, subchondral sclerosis, periarticular osteophytes, and joint space narrowing by imaging studies. This condition presents safety issues increasing the risk of falls.  There is no current active infection.  There are no problems to display for this patient.  Past Medical History:  Diagnosis Date   Arthritis     Past Surgical History:  Procedure Laterality Date   KNEE ARTHROSCOPY Right     Current Outpatient Medications  Medication Sig Dispense Refill Last Dose   acetaminophen (TYLENOL) 500 MG tablet Take 1,000 mg by mouth every 6 (six) hours as needed for moderate pain or mild pain.      aspirin EC 81 MG tablet Take 81 mg by mouth daily. Swallow whole.      buPROPion (WELLBUTRIN XL) 300 MG 24 hr tablet Take 300 mg by mouth daily.      busPIRone (BUSPAR) 10 MG tablet Take 5 mg by mouth 3 (three) times daily as needed (Anxiety).      doxepin (SINEQUAN) 150 MG capsule Take 150 mg by mouth at bedtime.      gabapentin (NEURONTIN) 300 MG capsule Take 900 mg by  mouth at bedtime.      meloxicam (MOBIC) 15 MG tablet Take 15 mg by mouth daily.      OVER THE COUNTER MEDICATION Place 1 drop into both ears 2 (two) times a week. Natural Ear      Polyethyl Glycol-Propyl Glycol (SYSTANE ULTRA OP) Place 2 drops into both eyes daily as needed (Dry eyes).      rosuvastatin (CRESTOR) 40 MG tablet Take 40 mg by mouth daily.      sildenafil (VIAGRA) 100 MG tablet Take 100 mg by mouth as needed for erectile dysfunction.      traMADol (ULTRAM) 50 MG tablet Take 50 mg by mouth every 12 (twelve) hours as needed for moderate pain or severe pain.      No current facility-administered medications for this visit.   Allergies  Allergen Reactions   Meperidine Other (See Comments)    Delusions (intolerance), Psychosis,hallucinations    Social History   Tobacco Use   Smoking status: Former   Smokeless tobacco: Never  Substance Use Topics   Alcohol use: No    Family History  Problem Relation Age of Onset   Cancer Mother    Diabetes Father    Hypertension Father    Clotting disorder Sister    Clotting disorder Paternal Aunt    Clotting disorder Paternal Uncle      Review of Systems  Musculoskeletal:  Positive for arthralgias and gait problem.  All other systems reviewed and are negative.   Objective:  Physical Exam Constitutional:      Appearance: Normal appearance.  HENT:     Head: Normocephalic and atraumatic.     Nose: Nose normal.     Mouth/Throat:     Mouth: Mucous membranes are moist.     Pharynx: Oropharynx is clear.  Eyes:     Conjunctiva/sclera: Conjunctivae normal.  Cardiovascular:     Rate and Rhythm: Normal rate and regular rhythm.     Pulses: Normal pulses.     Heart sounds: Normal heart sounds.  Pulmonary:     Effort: Pulmonary effort is normal.     Breath sounds: Normal breath sounds.  Abdominal:     General: Abdomen is flat.     Palpations: Abdomen is soft.  Genitourinary:    Comments: deferred Musculoskeletal:      Cervical back: Normal range of motion and neck supple.     Comments: Examination of the right hip reveals no skin wounds or lesions. Pain with flexion and rotation of the hip. Mild motion restriction. Mild trochanteric tenderness to palpation.   Neurovascularly intact distally.   Skin:    General: Skin is warm and dry.     Capillary Refill: Capillary refill takes less than 2 seconds.  Neurological:     General: No focal deficit present.     Mental Status: He is alert and oriented to person, place, and time.  Psychiatric:        Mood and Affect: Mood normal.        Behavior: Behavior normal.        Thought Content: Thought content normal.        Judgment: Judgment normal.     Vital signs in last 24 hours: @VSRANGES @  Labs:   Estimated body mass index is 39 kg/m (pended) as calculated from the following:   Height as of an earlier encounter on 06/25/22: (P) 5' 8.5" (1.74 m).   Weight as of an earlier encounter on 06/25/22: (P) 118 kg.   Imaging Review Plain radiographs demonstrate severe degenerative joint disease of the right hip(s). The bone quality appears to be adequate for age and reported activity level.      Assessment/Plan:  End stage arthritis, right hip(s)  The patient history, physical examination, clinical judgement of the provider and imaging studies are consistent with end stage degenerative joint disease of the right hip(s) and total hip arthroplasty is deemed medically necessary. The treatment options including medical management, injection therapy, arthroscopy and arthroplasty were discussed at length. The risks and benefits of total hip arthroplasty were presented and reviewed. The risks due to aseptic loosening, infection, stiffness, dislocation/subluxation,  thromboembolic complications and other imponderables were discussed.  The patient acknowledged the explanation, agreed to proceed with the plan and consent was signed. Patient is being admitted for  inpatient treatment for surgery, pain control, PT, OT, prophylactic antibiotics, VTE prophylaxis, progressive ambulation and ADL's and discharge planning.The patient is planning to be discharged home with HEP.   Therapy Plans: HEP. Discussed the importance of hip abductor strengthening exercises including 150-300 standing side leg raises per day. Also, non-pounding exercises with a stationary bike/elliptical for 30 minutes 3x/week. Disposition: Home with wife. Planned DVT Prophylaxis: aspirin 81mg  BID DME needed: walker. Printed copy given to patient and prescription sent to [place].  PCP: Cleared TXA: IV Allergies:  - Demerol - psychotic episode, 57 yo.  Anesthesia Concerns: None.  BMI: 39.0 Last HgbA1c: 6.5 Other: - He recently saw AO 06/08/22 due to a twisting left knee  injury. Left knee MRI showed medial meniscal tear, patient reports knee more stable and pain improved. Can proceed with surgery of his right hip.   - Hydrocodone, zofran, methocarbamol, meloxicam.  - 06/25/22: Hgb 14.5. No BMP ordered.     Patient's anticipated LOS is less than 2 midnights, meeting these requirements: - Younger than 83 - Lives within 1 hour of care - Has a competent adult at home to recover with post-op recover - NO history of  - Chronic pain requiring opiods  - Diabetes  - Coronary Artery Disease  - Heart failure  - Heart attack  - Stroke  - DVT/VTE  - Cardiac arrhythmia  - Respiratory Failure/COPD  - Renal failure  - Anemia  - Advanced Liver disease

## 2022-06-26 ENCOUNTER — Encounter (HOSPITAL_COMMUNITY): Payer: Self-pay

## 2022-06-27 ENCOUNTER — Encounter (HOSPITAL_COMMUNITY): Payer: Self-pay | Admitting: Orthopedic Surgery

## 2022-06-27 NOTE — Anesthesia Preprocedure Evaluation (Signed)
Anesthesia Evaluation  Patient identified by MRN, date of birth, ID band Patient awake    Reviewed: Allergy & Precautions, Patient's Chart, lab work & pertinent test results  Airway Mallampati: II       Dental no notable dental hx. (+) Caps, Dental Advisory Given   Pulmonary former smoker   Pulmonary exam normal breath sounds clear to auscultation       Cardiovascular negative cardio ROS Normal cardiovascular exam Rhythm:Regular Rate:Normal     Neuro/Psych  negative psych ROS   GI/Hepatic negative GI ROS, Neg liver ROS,,,  Endo/Other  Hyperlipidemia Obesity Pre diabetes  Renal/GU negative Renal ROS  negative genitourinary   Musculoskeletal  (+) Arthritis , Osteoarthritis,  OA right hip   Abdominal  (+) + obese  Peds  Hematology negative hematology ROS (+)   Anesthesia Other Findings   Reproductive/Obstetrics ED                              Anesthesia Physical Anesthesia Plan  ASA: 2  Anesthesia Plan: Spinal   Post-op Pain Management: Dilaudid IV and Precedex   Induction: Intravenous  PONV Risk Score and Plan: 2 and Treatment may vary due to age or medical condition, Ondansetron and Propofol infusion  Airway Management Planned:   Additional Equipment:   Intra-op Plan:   Post-operative Plan:   Informed Consent: I have reviewed the patients History and Physical, chart, labs and discussed the procedure including the risks, benefits and alternatives for the proposed anesthesia with the patient or authorized representative who has indicated his/her understanding and acceptance.     Dental advisory given  Plan Discussed with: Anesthesiologist and CRNA  Anesthesia Plan Comments:          Anesthesia Quick Evaluation

## 2022-06-28 ENCOUNTER — Ambulatory Visit (HOSPITAL_BASED_OUTPATIENT_CLINIC_OR_DEPARTMENT_OTHER): Payer: No Typology Code available for payment source | Admitting: Anesthesiology

## 2022-06-28 ENCOUNTER — Other Ambulatory Visit: Payer: Self-pay

## 2022-06-28 ENCOUNTER — Ambulatory Visit (HOSPITAL_COMMUNITY)
Admission: RE | Admit: 2022-06-28 | Discharge: 2022-06-28 | Disposition: A | Discharge status: Home / Self Care | Payer: No Typology Code available for payment source | Attending: Orthopedic Surgery | Admitting: Orthopedic Surgery

## 2022-06-28 ENCOUNTER — Ambulatory Visit (HOSPITAL_COMMUNITY): Payer: No Typology Code available for payment source

## 2022-06-28 ENCOUNTER — Encounter (HOSPITAL_COMMUNITY)
Admission: RE | Disposition: A | Discharge status: Home / Self Care | Payer: Self-pay | Source: Home / Self Care | Attending: Orthopedic Surgery

## 2022-06-28 ENCOUNTER — Ambulatory Visit (HOSPITAL_COMMUNITY): Payer: No Typology Code available for payment source | Admitting: Medical

## 2022-06-28 ENCOUNTER — Encounter (HOSPITAL_COMMUNITY): Payer: Self-pay | Admitting: Orthopedic Surgery

## 2022-06-28 DIAGNOSIS — M1611 Unilateral primary osteoarthritis, right hip: Secondary | ICD-10-CM

## 2022-06-28 DIAGNOSIS — R7303 Prediabetes: Secondary | ICD-10-CM | POA: Diagnosis not present

## 2022-06-28 DIAGNOSIS — Z87891 Personal history of nicotine dependence: Secondary | ICD-10-CM | POA: Insufficient documentation

## 2022-06-28 DIAGNOSIS — E785 Hyperlipidemia, unspecified: Secondary | ICD-10-CM

## 2022-06-28 DIAGNOSIS — Z01818 Encounter for other preprocedural examination: Secondary | ICD-10-CM

## 2022-06-28 DIAGNOSIS — Z96641 Presence of right artificial hip joint: Secondary | ICD-10-CM

## 2022-06-28 DIAGNOSIS — M25751 Osteophyte, right hip: Secondary | ICD-10-CM | POA: Diagnosis not present

## 2022-06-28 HISTORY — PX: TOTAL HIP ARTHROPLASTY: SHX124

## 2022-06-28 SURGERY — ARTHROPLASTY, HIP, TOTAL, ANTERIOR APPROACH
Anesthesia: Spinal | Site: Hip | Laterality: Right

## 2022-06-28 MED ORDER — LACTATED RINGERS IV SOLN: INTRAVENOUS | Status: DC

## 2022-06-28 MED ORDER — PHENYLEPHRINE HCL (PRESSORS) 10 MG/ML IV SOLN
INTRAVENOUS | Status: AC
  Filled 2022-06-28: qty 1

## 2022-06-28 MED ORDER — SODIUM CHLORIDE (PF) 0.9 % IJ SOLN
INTRAMUSCULAR | Status: AC
  Filled 2022-06-28: qty 30

## 2022-06-28 MED ORDER — HYDROCODONE-ACETAMINOPHEN 5-325 MG PO TABS: 1.0000 | ORAL_TABLET | ORAL | 0 refills | Status: AC | PRN

## 2022-06-28 MED ORDER — POLYETHYLENE GLYCOL 3350 17 G PO PACK: 17.0000 g | PACK | Freq: Every day | ORAL | 0 refills | Status: AC | PRN

## 2022-06-28 MED ORDER — EPHEDRINE SULFATE-NACL 50-0.9 MG/10ML-% IV SOSY
PREFILLED_SYRINGE | INTRAVENOUS | Status: DC | PRN
  Administered 2022-06-28 (×2): 5 mg via INTRAVENOUS
  Administered 2022-06-28: 10 mg via INTRAVENOUS

## 2022-06-28 MED ORDER — POVIDONE-IODINE 10 % EX SWAB: 2.0000 | Freq: Once | CUTANEOUS | Status: DC

## 2022-06-28 MED ORDER — CEFAZOLIN SODIUM-DEXTROSE 2-4 GM/100ML-% IV SOLN
INTRAVENOUS | Status: AC
  Filled 2022-06-28: qty 100

## 2022-06-28 MED ORDER — METHOCARBAMOL 500 MG PO TABS
ORAL_TABLET | ORAL | Status: AC
  Filled 2022-06-28: qty 1

## 2022-06-28 MED ORDER — EPHEDRINE 5 MG/ML INJ
INTRAVENOUS | Status: AC
  Filled 2022-06-28: qty 5

## 2022-06-28 MED ORDER — MIDAZOLAM HCL 2 MG/2ML IJ SOLN
INTRAMUSCULAR | Status: DC | PRN
  Administered 2022-06-28: 2 mg via INTRAVENOUS

## 2022-06-28 MED ORDER — TRANEXAMIC ACID-NACL 1000-0.7 MG/100ML-% IV SOLN
INTRAVENOUS | Status: AC
  Filled 2022-06-28: qty 100

## 2022-06-28 MED ORDER — EPINEPHRINE PF 1 MG/ML IJ SOLN
INTRAMUSCULAR | Status: AC
  Filled 2022-06-28: qty 1

## 2022-06-28 MED ORDER — PROPOFOL 1000 MG/100ML IV EMUL
INTRAVENOUS | Status: AC
  Filled 2022-06-28: qty 100

## 2022-06-28 MED ORDER — ACETAMINOPHEN 500 MG PO TABS: 1000.0000 mg | ORAL_TABLET | Freq: Three times a day (TID) | ORAL | 0 refills | Status: AC | PRN

## 2022-06-28 MED ORDER — HYDROCODONE-ACETAMINOPHEN 5-325 MG PO TABS
ORAL_TABLET | ORAL | Status: AC
  Administered 2022-06-28: 1 via ORAL
  Filled 2022-06-28: qty 1

## 2022-06-28 MED ORDER — HYDROCODONE-ACETAMINOPHEN 7.5-325 MG PO TABS: 1.0000 | ORAL_TABLET | ORAL | Status: DC | PRN

## 2022-06-28 MED ORDER — VASOPRESSIN 20 UNIT/ML IV SOLN
INTRAVENOUS | Status: AC
  Filled 2022-06-28: qty 1

## 2022-06-28 MED ORDER — SENNA 8.6 MG PO TABS: 2.0000 | ORAL_TABLET | Freq: Every day | ORAL | 0 refills | Status: AC

## 2022-06-28 MED ORDER — ONDANSETRON HCL 4 MG/2ML IJ SOLN
INTRAMUSCULAR | Status: AC
  Filled 2022-06-28: qty 2

## 2022-06-28 MED ORDER — CHLORHEXIDINE GLUCONATE 0.12 % MT SOLN
15.0000 mL | Freq: Once | OROMUCOSAL | Status: AC
  Administered 2022-06-28: 15 mL via OROMUCOSAL

## 2022-06-28 MED ORDER — SODIUM CHLORIDE (PF) 0.9 % IJ SOLN
INTRAMUSCULAR | Status: DC | PRN
  Administered 2022-06-28: 30 mL

## 2022-06-28 MED ORDER — ONDANSETRON HCL 4 MG PO TABS: 4.0000 mg | ORAL_TABLET | Freq: Three times a day (TID) | ORAL | 0 refills | Status: AC | PRN

## 2022-06-28 MED ORDER — LACTATED RINGERS IV BOLUS
250.0000 mL | Freq: Once | INTRAVENOUS | Status: AC
  Administered 2022-06-28: 250 mL via INTRAVENOUS

## 2022-06-28 MED ORDER — ALBUMIN HUMAN 5 % IV SOLN: INTRAVENOUS | Status: DC | PRN

## 2022-06-28 MED ORDER — OXYCODONE HCL 5 MG/5ML PO SOLN: 5.0000 mg | Freq: Once | ORAL | Status: DC | PRN

## 2022-06-28 MED ORDER — SODIUM CHLORIDE 0.9 % IR SOLN
Status: DC | PRN
  Administered 2022-06-28: 3000 mL
  Administered 2022-06-28: 1000 mL

## 2022-06-28 MED ORDER — TRANEXAMIC ACID-NACL 1000-0.7 MG/100ML-% IV SOLN
1000.0000 mg | Freq: Once | INTRAVENOUS | Status: AC
  Administered 2022-06-28: 1000 mg via INTRAVENOUS

## 2022-06-28 MED ORDER — ASPIRIN 81 MG PO CHEW: 81.0000 mg | CHEWABLE_TABLET | Freq: Two times a day (BID) | ORAL | 0 refills | Status: AC

## 2022-06-28 MED ORDER — DEXAMETHASONE SODIUM PHOSPHATE 10 MG/ML IJ SOLN
INTRAMUSCULAR | Status: AC
  Filled 2022-06-28: qty 1

## 2022-06-28 MED ORDER — HYDROMORPHONE HCL 1 MG/ML IJ SOLN: 0.2500 mg | INTRAMUSCULAR | Status: DC | PRN

## 2022-06-28 MED ORDER — DEXAMETHASONE SODIUM PHOSPHATE 10 MG/ML IJ SOLN
INTRAMUSCULAR | Status: DC | PRN
  Administered 2022-06-28: 8 mg via INTRAVENOUS

## 2022-06-28 MED ORDER — PROPOFOL 10 MG/ML IV BOLUS
INTRAVENOUS | Status: AC
  Filled 2022-06-28: qty 20

## 2022-06-28 MED ORDER — LACTATED RINGERS IV BOLUS
500.0000 mL | Freq: Once | INTRAVENOUS | Status: AC
  Administered 2022-06-28: 500 mL via INTRAVENOUS

## 2022-06-28 MED ORDER — PHENYLEPHRINE 80 MCG/ML (10ML) SYRINGE FOR IV PUSH (FOR BLOOD PRESSURE SUPPORT)
PREFILLED_SYRINGE | INTRAVENOUS | Status: DC | PRN
  Administered 2022-06-28 (×2): 80 ug via INTRAVENOUS
  Administered 2022-06-28 (×3): 160 ug via INTRAVENOUS
  Administered 2022-06-28 (×2): 80 ug via INTRAVENOUS

## 2022-06-28 MED ORDER — METHOCARBAMOL 500 MG PO TABS: 500.0000 mg | ORAL_TABLET | Freq: Four times a day (QID) | ORAL | 0 refills | Status: AC | PRN

## 2022-06-28 MED ORDER — BUPIVACAINE-EPINEPHRINE 0.25% -1:200000 IJ SOLN
INTRAMUSCULAR | Status: DC | PRN
  Administered 2022-06-28: 30 mL

## 2022-06-28 MED ORDER — ONDANSETRON HCL 4 MG/2ML IJ SOLN
INTRAMUSCULAR | Status: DC | PRN
  Administered 2022-06-28: 4 mg via INTRAVENOUS

## 2022-06-28 MED ORDER — ORAL CARE MOUTH RINSE: 15.0000 mL | Freq: Once | OROMUCOSAL | Status: AC

## 2022-06-28 MED ORDER — OXYCODONE HCL 5 MG PO TABS: 5.0000 mg | ORAL_TABLET | Freq: Once | ORAL | Status: DC | PRN

## 2022-06-28 MED ORDER — CEFAZOLIN SODIUM-DEXTROSE 2-4 GM/100ML-% IV SOLN
2.0000 g | INTRAVENOUS | Status: AC
  Administered 2022-06-28: 2 g via INTRAVENOUS
  Filled 2022-06-28: qty 100

## 2022-06-28 MED ORDER — DOCUSATE SODIUM 100 MG PO CAPS: 100.0000 mg | ORAL_CAPSULE | Freq: Two times a day (BID) | ORAL | 0 refills | Status: AC

## 2022-06-28 MED ORDER — MIDAZOLAM HCL 2 MG/2ML IJ SOLN
INTRAMUSCULAR | Status: AC
  Filled 2022-06-28: qty 2

## 2022-06-28 MED ORDER — HYDROCODONE-ACETAMINOPHEN 5-325 MG PO TABS: 1.0000 | ORAL_TABLET | ORAL | Status: DC | PRN

## 2022-06-28 MED ORDER — ALBUMIN HUMAN 5 % IV SOLN
INTRAVENOUS | Status: AC
  Filled 2022-06-28: qty 250

## 2022-06-28 MED ORDER — KETOROLAC TROMETHAMINE 30 MG/ML IJ SOLN
INTRAMUSCULAR | Status: DC | PRN
  Administered 2022-06-28: 30 mg via INTRAMUSCULAR

## 2022-06-28 MED ORDER — PROPOFOL 10 MG/ML IV BOLUS
INTRAVENOUS | Status: DC | PRN
  Administered 2022-06-28 (×2): 20 mg via INTRAVENOUS
  Administered 2022-06-28: 30 mg via INTRAVENOUS
  Administered 2022-06-28: 20 mg via INTRAVENOUS

## 2022-06-28 MED ORDER — BUPIVACAINE HCL (PF) 0.25 % IJ SOLN
INTRAMUSCULAR | Status: AC
  Filled 2022-06-28: qty 30

## 2022-06-28 MED ORDER — TRANEXAMIC ACID-NACL 1000-0.7 MG/100ML-% IV SOLN
1000.0000 mg | INTRAVENOUS | Status: AC
  Administered 2022-06-28: 1000 mg via INTRAVENOUS
  Filled 2022-06-28: qty 100

## 2022-06-28 MED ORDER — METHOCARBAMOL 500 MG IVPB - SIMPLE MED: 500.0000 mg | Freq: Four times a day (QID) | INTRAVENOUS | Status: DC | PRN

## 2022-06-28 MED ORDER — PROPOFOL 500 MG/50ML IV EMUL
INTRAVENOUS | Status: DC | PRN
  Administered 2022-06-28: 125 ug/kg/min via INTRAVENOUS

## 2022-06-28 MED ORDER — ONDANSETRON HCL 4 MG/2ML IJ SOLN: 4.0000 mg | Freq: Once | INTRAMUSCULAR | Status: DC | PRN

## 2022-06-28 MED ORDER — PHENYLEPHRINE HCL-NACL 20-0.9 MG/250ML-% IV SOLN
INTRAVENOUS | Status: DC | PRN
  Administered 2022-06-28: 40 ug/min via INTRAVENOUS

## 2022-06-28 MED ORDER — KETOROLAC TROMETHAMINE 30 MG/ML IJ SOLN
INTRAMUSCULAR | Status: AC
  Filled 2022-06-28: qty 1

## 2022-06-28 MED ORDER — CEFAZOLIN SODIUM-DEXTROSE 2-4 GM/100ML-% IV SOLN
2.0000 g | Freq: Four times a day (QID) | INTRAVENOUS | Status: DC
  Administered 2022-06-28: 2 g via INTRAVENOUS

## 2022-06-28 MED ORDER — ACETAMINOPHEN 500 MG PO TABS
1000.0000 mg | ORAL_TABLET | Freq: Once | ORAL | Status: AC
  Administered 2022-06-28: 1000 mg via ORAL
  Filled 2022-06-28: qty 2

## 2022-06-28 MED ORDER — ISOPROPYL ALCOHOL 70 % SOLN
Status: DC | PRN
  Administered 2022-06-28: 1 via TOPICAL

## 2022-06-28 MED ORDER — METHOCARBAMOL 500 MG PO TABS
500.0000 mg | ORAL_TABLET | Freq: Four times a day (QID) | ORAL | Status: DC | PRN
  Administered 2022-06-28: 500 mg via ORAL

## 2022-06-28 MED ORDER — BUPIVACAINE IN DEXTROSE 0.75-8.25 % IT SOLN
INTRATHECAL | Status: DC | PRN
  Administered 2022-06-28: 2 mL via INTRATHECAL

## 2022-06-28 SURGICAL SUPPLY — 55 items
ADH SKN CLS APL DERMABOND .7 (GAUZE/BANDAGES/DRESSINGS) ×1
APL PRP STRL LF DISP 70% ISPRP (MISCELLANEOUS) ×1
BAG COUNTER SPONGE SURGICOUNT (BAG) IMPLANT
BAG DECANTER FOR FLEXI CONT (MISCELLANEOUS) IMPLANT
BAG SPEC THK2 15X12 ZIP CLS (MISCELLANEOUS)
BAG SPNG CNTER NS LX DISP (BAG)
BAG ZIPLOCK 12X15 (MISCELLANEOUS) IMPLANT
CHLORAPREP W/TINT 26 (MISCELLANEOUS) ×1 IMPLANT
COVER PERINEAL POST (MISCELLANEOUS) ×1 IMPLANT
COVER SURGICAL LIGHT HANDLE (MISCELLANEOUS) ×1 IMPLANT
DERMABOND ADVANCED .7 DNX12 (GAUZE/BANDAGES/DRESSINGS) ×2 IMPLANT
DRAPE IMP U-DRAPE 54X76 (DRAPES) ×1 IMPLANT
DRAPE SHEET LG 3/4 BI-LAMINATE (DRAPES) ×3 IMPLANT
DRAPE STERI IOBAN 125X83 (DRAPES) ×1 IMPLANT
DRAPE U-SHAPE 47X51 STRL (DRAPES) ×1 IMPLANT
DRSG AQUACEL AG ADV 3.5X10 (GAUZE/BANDAGES/DRESSINGS) ×1 IMPLANT
ELECT REM PT RETURN 15FT ADLT (MISCELLANEOUS) ×1 IMPLANT
GAUZE SPONGE 4X4 12PLY STRL (GAUZE/BANDAGES/DRESSINGS) ×1 IMPLANT
GLOVE BIO SURGEON STRL SZ7 (GLOVE) ×1 IMPLANT
GLOVE BIO SURGEON STRL SZ8.5 (GLOVE) ×2 IMPLANT
GLOVE BIOGEL PI IND STRL 7.5 (GLOVE) ×1 IMPLANT
GLOVE BIOGEL PI IND STRL 8.5 (GLOVE) ×1 IMPLANT
GOWN SPEC L3 XXLG W/TWL (GOWN DISPOSABLE) ×1 IMPLANT
GOWN STRL REUS W/ TWL XL LVL3 (GOWN DISPOSABLE) ×1 IMPLANT
GOWN STRL REUS W/TWL XL LVL3 (GOWN DISPOSABLE) ×1
HANDPIECE INTERPULSE COAX TIP (DISPOSABLE) ×1
HEAD CERAMIC BIOLOX 36 T1 STD (Head) IMPLANT
HOLDER FOLEY CATH W/STRAP (MISCELLANEOUS) ×1 IMPLANT
HOOD PEEL AWAY T7 (MISCELLANEOUS) ×3 IMPLANT
KIT TURNOVER KIT A (KITS) IMPLANT
LINER ACETAB VIT E +5 36 F (Liner) IMPLANT
MANIFOLD NEPTUNE II (INSTRUMENTS) ×1 IMPLANT
MARKER SKIN DUAL TIP RULER LAB (MISCELLANEOUS) ×1 IMPLANT
NDL SAFETY ECLIP 18X1.5 (MISCELLANEOUS) ×1 IMPLANT
NDL SPNL 18GX3.5 QUINCKE PK (NEEDLE) ×1 IMPLANT
NEEDLE SPNL 18GX3.5 QUINCKE PK (NEEDLE) ×1 IMPLANT
PACK ANTERIOR HIP CUSTOM (KITS) ×1 IMPLANT
SAW OSC TIP CART 19.5X105X1.3 (SAW) ×1 IMPLANT
SEALER BIPOLAR AQUA 6.0 (INSTRUMENTS) ×1 IMPLANT
SET HNDPC FAN SPRY TIP SCT (DISPOSABLE) ×1 IMPLANT
SHELL ACET G7 4H 56 SZF (Shell) IMPLANT
SOLUTION PRONTOSAN WOUND 350ML (IRRIGATION / IRRIGATOR) ×1 IMPLANT
SPIKE FLUID TRANSFER (MISCELLANEOUS) ×1 IMPLANT
STEM FEM REDUCE DISTAL 11X107 (Stem) IMPLANT
SUT MNCRL AB 3-0 PS2 18 (SUTURE) ×1 IMPLANT
SUT MON AB 2-0 CT1 36 (SUTURE) ×1 IMPLANT
SUT STRATAFIX PDO 1 14 VIOLET (SUTURE) ×1
SUT STRATFX PDO 1 14 VIOLET (SUTURE) ×1
SUT VIC AB 2-0 CT1 27 (SUTURE)
SUT VIC AB 2-0 CT1 TAPERPNT 27 (SUTURE) IMPLANT
SUTURE STRATFX PDO 1 14 VIOLET (SUTURE) ×1 IMPLANT
SYR 3ML LL SCALE MARK (SYRINGE) ×1 IMPLANT
TRAY FOLEY MTR SLVR 16FR STAT (SET/KITS/TRAYS/PACK) IMPLANT
TUBE SUCTION HIGH CAP CLEAR NV (SUCTIONS) ×1 IMPLANT
WATER STERILE IRR 1000ML POUR (IV SOLUTION) ×1 IMPLANT

## 2022-06-28 NOTE — Anesthesia Procedure Notes (Signed)
Spinal  Patient location during procedure: OR Start time: 06/28/2022 7:31 AM End time: 06/28/2022 7:38 AM Reason for block: surgical anesthesia Staffing Performed: anesthesiologist  Anesthesiologist: Mal Amabile, MD Performed by: Mal Amabile, MD Authorized by: Mal Amabile, MD   Preanesthetic Checklist Completed: patient identified, IV checked, site marked, risks and benefits discussed, surgical consent, monitors and equipment checked, pre-op evaluation and timeout performed Spinal Block Patient position: sitting Prep: DuraPrep and site prepped and draped Patient monitoring: heart rate, cardiac monitor, continuous pulse ox and blood pressure Approach: midline Location: L3-4 Injection technique: single-shot Needle Needle type: Whitacre  Needle gauge: 22 G Needle length: 9 cm Needle insertion depth: 10 cm Assessment Sensory level: T6 Events: CSF return Additional Notes Patient tolerated procedure well. Adequate sensory level. Technically difficult due to obesity and poor position and lack of palpable landmarks. Attempt x 4

## 2022-06-28 NOTE — Op Note (Signed)
OPERATIVE REPORT  SURGEON: Samson Frederic, MD   ASSISTANT: Clint Bolder, PA-C.  PREOPERATIVE DIAGNOSIS: Right hip arthritis.   POSTOPERATIVE DIAGNOSIS: Right hip arthritis.   PROCEDURE: Right total hip arthroplasty, anterior approach.   IMPLANTS: Biomet Taperloc Complete Microplasty stem, size 11 x 107.5 mm, high offset. Biomet G7 OsseoTi Cup, size 56 mm. Biomet Vivacit-E liner, size 36 mm, F, neutral. Biomet Biolox ceramic head ball, size 36 + 0 mm.  ANESTHESIA:  MAC and Spinal  ESTIMATED BLOOD LOSS:-350 mL    ANTIBIOTICS: 3g Ancef.  DRAINS: None.  COMPLICATIONS: None.   CONDITION: PACU - hemodynamically stable.   BRIEF CLINICAL NOTE: Wayne Daniels is a 57 y.o. male with a long-standing history of Right hip arthritis. After failing conservative management, the patient was indicated for total hip arthroplasty. The risks, benefits, and alternatives to the procedure were explained, and the patient elected to proceed.  PROCEDURE IN DETAIL: Surgical site was marked by myself in the pre-op holding area. Once inside the operating room, spinal anesthesia was obtained, and a foley catheter was inserted. The patient was then positioned on the Hana table.  All bony prominences were well padded.  The hip was prepped and draped in the normal sterile surgical fashion.  A time-out was called verifying side and site of surgery. The patient received IV antibiotics within 60 minutes of beginning the procedure.   Bikini incision was made, and superficial dissection was performed lateral to the ASIS. The direct anterior approach to the hip was performed through the Hueter interval.  Lateral femoral circumflex vessels were treated with the Auqumantys. The anterior capsule was exposed and an inverted T capsulotomy was made. The femoral neck cut was made to the level of the templated cut.  A corkscrew was placed into the head and the head was removed.  The femoral head was found to have eburnated  bone. The head was passed to the back table and was measured. Pubofemoral ligament was released off of the calcar, taking care to stay on bone. Superior capsule was released from the greater trochanter, taking care to stay lateral to the posterior border of the femoral neck in order to preserve the short external rotators.   Acetabular exposure was achieved, and the pulvinar and labrum were excised. Sequential reaming of the acetabulum was then performed up to a size 55 mm reamer. A 56 mm cup was then opened and impacted into place at approximately 40 degrees of abduction and 20 degrees of anteversion. The final polyethylene liner was impacted into place and acetabular osteophytes were removed.    I then gained femoral exposure taking care to protect the abductors and greater trochanter.  This was performed using standard external rotation, extension, and adduction.  A cookie cutter was used to enter the femoral canal, and then the femoral canal finder was placed.  Sequential broaching was performed up to a size 11.  Calcar planer was used on the femoral neck remnant.  I placed a high offset neck and a trial head ball.  The hip was reduced.  Leg lengths and offset were checked fluoroscopically.  The hip was dislocated and trial components were removed.  The final implants were placed, and the hip was reduced.  Fluoroscopy was used to confirm component position and leg lengths.  At 90 degrees of external rotation and full extension, the hip was stable to an anterior directed force.   The wound was copiously irrigated with Prontosan solution and normal saline using pule lavage.  Marcaine  solution was injected into the periarticular soft tissue.  The wound was closed in layers using #1 Vicryl and V-Loc for the fascia, 2-0 Vicryl for the subcutaneous fat, 2-0 Monocryl for the deep dermal layer, 3-0 running Monocryl subcuticular stitch, and Dermabond for the skin.  Once the glue was fully dried, an Aquacell Ag  dressing was applied.  The patient was transported to the recovery room in stable condition.  Sponge, needle, and instrument counts were correct at the end of the case x2.  The patient tolerated the procedure well and there were no known complications.  The aquamantis was utilized for this case to help facilitate better hemostasis as patient was felt to be at increased risk of bleeding because of obesity.  A oscillating saw tip was utilized for this case to prevent damage to the soft tissue structures such as muscles, ligaments and tendons, and to ensure accurate bone cuts. This patient was at increased risk for above structures due to  minimally invasive approach.  Please note that a surgical assistant was a medical necessity for this procedure to perform it in a safe and expeditious manner. Assistant was necessary to provide appropriate retraction of vital neurovascular structures, to prevent femoral fracture, and to allow for anatomic placement of the prosthesis.

## 2022-06-28 NOTE — Anesthesia Postprocedure Evaluation (Signed)
Anesthesia Post Note  Patient: Wayne Daniels  Procedure(s) Performed: TOTAL HIP ARTHROPLASTY ANTERIOR APPROACH (Right: Hip)     Patient location during evaluation: PACU Anesthesia Type: Spinal Level of consciousness: oriented and awake and alert Pain management: pain level controlled Vital Signs Assessment: post-procedure vital signs reviewed and stable Respiratory status: spontaneous breathing, respiratory function stable and nonlabored ventilation Cardiovascular status: blood pressure returned to baseline and stable Postop Assessment: no headache, no backache, no apparent nausea or vomiting and spinal receding Anesthetic complications: no   No notable events documented.  Last Vitals:  Vitals:   06/28/22 1035 06/28/22 1040  BP: 131/82   Pulse: 80 84  Resp: 17 16  Temp:    SpO2: 97% 99%    Last Pain:  Vitals:   06/28/22 1030  PainSc: 0-No pain                 Tonie Vizcarrondo A.

## 2022-06-28 NOTE — Anesthesia Procedure Notes (Signed)
Procedure Name: MAC Date/Time: 06/28/2022 7:30 AM  Performed by: Nelle Don, CRNAPre-anesthesia Checklist: Patient identified, Emergency Drugs available, Suction available and Patient being monitored Oxygen Delivery Method: Simple face mask

## 2022-06-28 NOTE — Discharge Instructions (Signed)
? ?Dr. Brian Swinteck ?Joint Replacement Specialist ?[facility] ?[address] ?[place], [place] [address] ?[phone] ? ? ?TOTAL HIP REPLACEMENT POSTOPERATIVE DIRECTIONS ? ? ? ?Hip Rehabilitation, Guidelines Following Surgery  ? ?WEIGHT BEARING ?Weight bearing as tolerated with assist device (walker, cane, etc) as directed, use it as long as suggested by your surgeon or therapist, typically at least 4-6 weeks. ? ?The results of a hip operation are greatly improved after range of motion and muscle strengthening exercises. Follow all safety measures which are given to protect your hip. If any of these exercises cause increased pain or swelling in your joint, decrease the amount until you are comfortable again. Then slowly increase the exercises. Call your caregiver if you have problems or questions.  ? ?HOME CARE INSTRUCTIONS  ?Most of the following instructions are designed to prevent the dislocation of your new hip.  ?Remove items at home which could result in a fall. This includes throw rugs or furniture in walking pathways.  ?Continue medications as instructed at time of discharge. ?You may have some home medications which will be placed on hold until you complete the course of blood thinner medication. ?You may start showering once you are discharged home. Do not remove your dressing. ?Do not put on socks or shoes without following the instructions of your caregivers.   ?Sit on chairs with arms. Use the chair arms to help push yourself up when arising.  ?Arrange for the use of a toilet seat elevator so you are not sitting low.  ?Walk with walker as instructed.  ?You may resume a sexual relationship in one month or when given the OK by your caregiver.  ?Use walker as long as suggested by your caregivers.  ?You may put full weight on your legs and walk as much as is comfortable. ?Avoid periods of inactivity such as sitting longer than an hour when not asleep. This helps prevent blood  clots.  ?You may return to work once you are cleared by your surgeon.  ?Do not drive a car for 6 weeks or until released by your surgeon.  ?Do not drive while taking narcotics.  ?Wear elastic stockings for two weeks following surgery during the day but you may remove then at night.  ?Make sure you keep all of your appointments after your operation with all of your doctors and caregivers. You should call the office at the above phone number and make an appointment for approximately two weeks after the date of your surgery. ?Please pick up a stool softener and laxative for home use as long as you are requiring pain medications. ?ICE to the affected hip every three hours for 30 minutes at a time and then as needed for pain and swelling. Continue to use ice on the hip for pain and swelling from surgery. You may notice swelling that will progress down to the foot and ankle.  This is normal after surgery.  Elevate the leg when you are not up walking on it.   ?It is important for you to complete the blood thinner medication as prescribed by your doctor. ?Continue to use the breathing machine which will help keep your temperature down.  It is common for your temperature to cycle up and down following surgery, especially at night when you are not up moving around and exerting yourself.  The breathing machine keeps your lungs expanded and your temperature down. ? ?RANGE OF MOTION AND STRENGTHENING EXERCISES  ?These exercises are designed to help you   keep full movement of your hip joint. Follow your caregiver's or physical therapist's instructions. Perform all exercises about fifteen times, three times per day or as directed. Exercise both hips, even if you have had only one joint replacement. These exercises can be done on a training (exercise) mat, on the floor, on a table or on a bed. Use whatever works the best and is most comfortable for you. Use music or television while you are exercising so that the exercises are a  pleasant break in your day. This will make your life better with the exercises acting as a break in routine you can look forward to.  ?Lying on your back, slowly slide your foot toward your buttocks, raising your knee up off the floor. Then slowly slide your foot back down until your leg is straight again.  ?Lying on your back spread your legs as far apart as you can without causing discomfort.  ?Lying on your side, raise your upper leg and foot straight up from the floor as far as is comfortable. Slowly lower the leg and repeat.  ?Lying on your back, tighten up the muscle in the front of your thigh (quadriceps muscles). You can do this by keeping your leg straight and trying to raise your heel off the floor. This helps strengthen the largest muscle supporting your knee.  ?Lying on your back, tighten up the muscles of your buttocks both with the legs straight and with the knee bent at a comfortable angle while keeping your heel on the floor.  ? ?SKILLED REHAB INSTRUCTIONS: ?If the patient is transferred to a skilled rehab facility following release from the hospital, a list of the current medications will be sent to the facility for the patient to continue.  When discharged from the skilled rehab facility, please have the facility set up the patient's Home Health Physical Therapy prior to being released. Also, the skilled facility will be responsible for providing the patient with their medications at time of release from the facility to include their pain medication and their blood thinner medication. If the patient is still at the rehab facility at time of the two week follow up appointment, the skilled rehab facility will also need to assist the patient in arranging follow up appointment in our office and any transportation needs. ? ?POST-OPERATIVE OPIOID TAPER INSTRUCTIONS: ?It is important to wean off of your opioid medication as soon as possible. If you do not need pain medication after your surgery it is ok  to stop day one. ?Opioids include: ?Codeine, Hydrocodone(Norco, Vicodin), Oxycodone(Percocet, oxycontin) and hydromorphone amongst others.  ?Long term and even short term use of opiods can cause: ?Increased pain response ?Dependence ?Constipation ?Depression ?Respiratory depression ?And more.  ?Withdrawal symptoms can include ?Flu like symptoms ?Nausea, vomiting ?And more ?Techniques to manage these symptoms ?Hydrate well ?Eat regular healthy meals ?Stay active ?Use relaxation techniques(deep breathing, meditating, yoga) ?Do Not substitute Alcohol to help with tapering ?If you have been on opioids for less than two weeks and do not have pain than it is ok to stop all together.  ?Plan to wean off of opioids ?This plan should start within one week post op of your joint replacement. ?Maintain the same interval or time between taking each dose and first decrease the dose.  ?Cut the total daily intake of opioids by one tablet each day ?Next start to increase the time between doses. ?The last dose that should be eliminated is the evening dose.  ? ? ?MAKE   SURE YOU:  ?Understand these instructions.  ?Will watch your condition.  ?Will get help right away if you are not doing well or get worse. ? ?Pick up stool softner and laxative for home use following surgery while on pain medications. ?Do not remove your dressing. ?The dressing is waterproof--it is OK to take showers. ?Continue to use ice for pain and swelling after surgery. ?Do not use any lotions or creams on the incision until instructed by your surgeon. ?Total Hip Protocol. ? ?

## 2022-06-28 NOTE — Interval H&P Note (Signed)
History and Physical Interval Note:  06/28/2022 6:59 AM  Wayne Daniels  has presented today for surgery, with the diagnosis of right hip osteoarthritis.  The various methods of treatment have been discussed with the patient and family. After consideration of risks, benefits and other options for treatment, the patient has consented to  Procedure(s): TOTAL HIP ARTHROPLASTY ANTERIOR APPROACH (Right) as a surgical intervention.  The patient's history has been reviewed, patient examined, no change in status, stable for surgery.  I have reviewed the patient's chart and labs.  Questions were answered to the patient's satisfaction.     Iline Oven Aleshka Corney

## 2022-06-28 NOTE — Evaluation (Signed)
Physical Therapy Evaluation Patient Details Name: Wayne Daniels MRN: [id number] DOB: [birth year] Today's Date: 06/28/2022  History of Present Illness  Pt s/p R THR  Clinical Impression  Pt s/p R THR and presents with decreased R LE strength/ROM and post op pain limiting functional mobility.  This date, pt performed HEP with written instruction provided, up to ambulate in hall, and negotiated stair.  Pt eager for dc home this date.     Recommendations for follow up therapy are one component of a multi-disciplinary discharge planning process, led by the attending physician.  Recommendations may be updated based on patient status, additional functional criteria and insurance authorization.  Follow Up Recommendations       Assistance Recommended at Discharge Set up Supervision/Assistance  Patient can return home with the following       Equipment Recommendations Rolling walker (2 wheels)  Recommendations for Other Services       Functional Status Assessment Patient has had a recent decline in their functional status and demonstrates the ability to make significant improvements in function in a reasonable and predictable amount of time.     Precautions / Restrictions Precautions Precautions: Fall Restrictions Weight Bearing Restrictions: No Other Position/Activity Restrictions: WBAT      Mobility  Bed Mobility Overal bed mobility: Needs Assistance Bed Mobility: Supine to Sit     Supine to sit: Min guard     General bed mobility comments: cues for sequence and use of L LE to self assist    Transfers Overall transfer level: Needs assistance Equipment used: Rolling walker (2 wheels) Transfers: Sit to/from Stand Sit to Stand: Min guard, Supervision           General transfer comment: cues for LE management and use of UEs to self assist    Ambulation/Gait Ambulation/Gait assistance: Min guard, Supervision Gait Distance (Feet): 140 Feet Assistive device: Rolling  walker (2 wheels) Gait Pattern/deviations: Step-to pattern, Step-through pattern, Decreased step length - right, Decreased step length - left, Shuffle, Trunk flexed Gait velocity: decr     General Gait Details: cues for sequence, posture, position from RW and increased ER on R  Stairs Stairs: Yes Stairs assistance: Min assist Stair Management: No rails, Step to pattern, Forwards, With walker Number of Stairs: 1 General stair comments: cues for sequence  Wheelchair Mobility    Modified Rankin (Stroke Patients Only)       Balance Overall balance assessment: Needs assistance Sitting-balance support: No upper extremity supported, Feet supported Sitting balance-Leahy Scale: Good     Standing balance support: No upper extremity supported Standing balance-Leahy Scale: Fair                               Pertinent Vitals/Pain Pain Assessment Pain Assessment: 0-10 Pain Score: 2  Pain Location: R hip Pain Descriptors / Indicators: Aching, Sore Pain Intervention(s): Limited activity within patient's tolerance, Premedicated before session, Monitored during session, Ice applied    Home Living Family/patient expects to be discharged to:: Private residence Living Arrangements: Spouse/significant other Available Help at Discharge: Family;Available 24 hours/day Type of Home: House Home Access: Stairs to enter   [employer] of Steps: 1   Home Layout: One level Home Equipment: Rollator (4 wheels)      Prior Function Prior Level of Function : Independent/Modified Independent                     Hand Dominance  Extremity/Trunk Assessment   Upper Extremity Assessment Upper Extremity Assessment: Overall WFL for tasks assessed    Lower Extremity Assessment Lower Extremity Assessment: RLE deficits/detail RLE Deficits / Details: AAROM to 90 flex and 20 abd    Cervical / Trunk Assessment Cervical / Trunk Assessment: Normal   Communication   Communication: No difficulties  Cognition Arousal/Alertness: Awake/alert Behavior During Therapy: WFL for tasks assessed/performed Overall Cognitive Status: Within Functional Limits for tasks assessed                                          General Comments      Exercises Total Joint Exercises Ankle Circles/Pumps: AROM, Both, 15 reps, Supine Quad Sets: AROM, Both, 10 reps, Supine Heel Slides: AAROM, 20 reps, Supine, Right Hip ABduction/ADduction: AAROM, Right, 15 reps, Supine Long Arc Quad: AROM, Right, 10 reps, Seated   Assessment/Plan    PT Assessment Patient needs continued PT services  PT Problem List Decreased strength;Decreased range of motion;Decreased activity tolerance;Decreased balance;Decreased mobility;Decreased knowledge of use of DME;Obesity;Pain       PT Treatment Interventions DME instruction;Gait training;Stair training;Functional mobility training;Therapeutic activities;Therapeutic exercise;Patient/family education    PT Goals (Current goals can be found in the Care Plan section)  Acute Rehab PT Goals Patient Stated Goal: Regain IND PT Goal Formulation: With patient Time For Goal Achievement: 07/05/22 Potential to Achieve Goals: Good    Frequency 7X/week     Co-evaluation               AM-PAC PT "6 Clicks" Mobility  Outcome Measure Help needed turning from your back to your side while in a flat bed without using bedrails?: A Little Help needed moving from lying on your back to sitting on the side of a flat bed without using bedrails?: A Little Help needed moving to and from a bed to a chair (including a wheelchair)?: A Little Help needed standing up from a chair using your arms (e.g., wheelchair or bedside chair)?: A Little Help needed to walk in hospital room?: A Little Help needed climbing 3-5 steps with a railing? : A Little 6 Click Score: 18    End of Session Equipment Utilized During Treatment: Gait  belt Activity Tolerance: Patient tolerated treatment well Patient left: in chair;with call bell/phone within reach;with nursing/sitter in room;with family/visitor present Nurse Communication: Mobility status PT Visit Diagnosis: Difficulty in walking, not elsewhere classified (R26.2)    Time: 1315-1401 PT Time Calculation (min) (ACUTE ONLY): 46 min   Charges:   PT Evaluation $PT Eval Low Complexity: 1 Low PT Treatments $Gait Training: 8-22 mins $Therapeutic Exercise: 8-22 mins        Mauro Kaufmann PT Acute Rehabilitation Services Pager [phone] Office [phone]   Lorena Benham 06/28/2022, 2:15 PM

## 2022-06-28 NOTE — Transfer of Care (Signed)
Immediate Anesthesia Transfer of Care Note  Patient: Wayne Daniels  Procedure(s) Performed: TOTAL HIP ARTHROPLASTY ANTERIOR APPROACH (Right: Hip)  Patient Location: PACU  Anesthesia Type:Spinal  Level of Consciousness: drowsy  Airway & Oxygen Therapy: Patient Spontanous Breathing and Patient connected to face mask oxygen  Post-op Assessment: Report given to RN and Post -op Vital signs reviewed and stable  Post vital signs: Reviewed and stable  Last Vitals:  Vitals Value Taken Time  BP 122/78 06/28/22 1004  Temp    Pulse 88 06/28/22 1005  Resp 19 06/28/22 1005  SpO2 100 % 06/28/22 1005  Vitals shown include unvalidated device data.  Last Pain:  Vitals:   06/28/22 0614  PainSc: 3       Patients Stated Pain Goal: 3 (06/28/22 1610)  Complications: No notable events documented.

## 2022-06-29 ENCOUNTER — Encounter (HOSPITAL_COMMUNITY): Payer: Self-pay | Admitting: Orthopedic Surgery
